# Patient Record
Sex: Female | Born: 1940 | Hispanic: No | Marital: Married | Smoking: Never smoker
Health system: Southern US, Community
[De-identification: ages and names within clinical notes are randomized; demographics above are authoritative.]

## PROBLEM LIST (undated history)

## (undated) DIAGNOSIS — Z9889 Other specified postprocedural states: Secondary | ICD-10-CM

## (undated) DIAGNOSIS — D649 Anemia, unspecified: Secondary | ICD-10-CM

## (undated) DIAGNOSIS — J189 Pneumonia, unspecified organism: Secondary | ICD-10-CM

## (undated) DIAGNOSIS — M1990 Unspecified osteoarthritis, unspecified site: Secondary | ICD-10-CM

## (undated) DIAGNOSIS — J45909 Unspecified asthma, uncomplicated: Secondary | ICD-10-CM

## (undated) DIAGNOSIS — R112 Nausea with vomiting, unspecified: Secondary | ICD-10-CM

## (undated) DIAGNOSIS — K759 Inflammatory liver disease, unspecified: Secondary | ICD-10-CM

## (undated) HISTORY — PX: TONSILLECTOMY: SUR1361

## (undated) HISTORY — PX: APPENDECTOMY: SHX54

## (undated) HISTORY — PX: OTHER SURGICAL HISTORY: SHX169

---

## 2017-01-02 ENCOUNTER — Other Ambulatory Visit: Payer: Self-pay | Admitting: Family Medicine

## 2017-01-02 DIAGNOSIS — R1084 Generalized abdominal pain: Secondary | ICD-10-CM

## 2017-01-11 ENCOUNTER — Ambulatory Visit
Admission: RE | Admit: 2017-01-11 | Discharge: 2017-01-11 | Disposition: A | Discharge status: Home / Self Care | Payer: Medicare Other | Source: Ambulatory Visit | Attending: Family Medicine | Admitting: Family Medicine

## 2017-01-11 DIAGNOSIS — R1084 Generalized abdominal pain: Secondary | ICD-10-CM

## 2017-01-25 ENCOUNTER — Other Ambulatory Visit: Payer: Self-pay | Admitting: Surgery

## 2017-02-01 ENCOUNTER — Other Ambulatory Visit: Payer: Self-pay | Admitting: Surgery

## 2017-02-11 NOTE — Patient Instructions (Signed)
Phoebe PerchJean E Neuroth  02/11/2017   Your procedure is scheduled on: 02/20/17  [facility]  Entrance [place]  elevators to 3rd floor to  Short Stay Center at     [phone].   Call this number if you have problems the morning of surgery [phone]    Remember: ONLY 1 PERSON MAY GO WITH YOU TO SHORT STAY TO GET  READY MORNING OF YOUR SURGERY.  Do not eat food or drink liquids :After Midnight.     Take these medicines the morning of surgery with A SIP OF WATER: NONE                                You may not have any metal on your body including hair pins and              piercings  Do not wear jewelry, make-up, lotions, powders or perfumes, deodorant             Do not wear nail polish.  Do not shave  48 hours prior to surgery.              Do not bring valuables to the hospital. [facility] IS NOT             RESPONSIBLE   FOR VALUABLES.  Contacts, dentures or bridgework may not be worn into surgery.      Patients discharged the day of surgery will not be allowed to drive home.  Name and phone number of your driver:  Special Instructions: N/A              Please read over the following fact sheets you were given: _____________________________________________________________________             [facility] - Preparing for [facility] Before surgery, you can play an important role.  Because skin is not sterile, your skin needs to be as free of germs as possible.  You can reduce the number of germs on your skin by washing with CHG (chlorahexidine gluconate) soap before surgery.  CHG is an antiseptic cleaner which kills germs and bonds with the skin to continue killing germs even after washing. Please DO NOT use if you have an allergy to CHG or antibacterial soaps.  If your skin becomes reddened/irritated stop using the CHG and inform your nurse when you arrive at Short Stay. Do not shave (including legs and underarms) for at least 48 hours prior to the first  CHG shower.  You may shave your face/neck. Please follow these instructions carefully:  1.  Shower with CHG Soap the night before surgery and the  morning of Surgery.  2.  If you choose to wash your hair, wash your hair first as usual with your  normal  shampoo.  3.  After you shampoo, rinse your hair and body thoroughly to remove the  shampoo.                           4.  Use CHG as you would any other liquid soap.  You can apply chg directly  to the skin and wash                       Gently with a scrungie or clean  washcloth.  5.  Apply the CHG Soap to your body ONLY FROM THE NECK DOWN.   Do not use on face/ open                           Wound or open sores. Avoid contact with eyes, ears mouth and genitals (private parts).                       Wash face,  Genitals (private parts) with your normal soap.             6.  Wash thoroughly, paying special attention to the area where your surgery  will be performed.  7.  Thoroughly rinse your body with warm water from the neck down.  8.  DO NOT shower/wash with your normal soap after using and rinsing off  the CHG Soap.                9.  Pat yourself dry with a clean towel.            10.  Wear clean pajamas.            11.  Place clean sheets on your bed the night of your first shower and do not  sleep with pets. Day of Surgery : Do not apply any lotions/deodorants the morning of surgery.  Please wear clean clothes to the hospital/surgery center.  FAILURE TO FOLLOW THESE INSTRUCTIONS MAY RESULT IN THE CANCELLATION OF YOUR SURGERY PATIENT SIGNATURE_________________________________  NURSE SIGNATURE__________________________________  ________________________________________________________________________

## 2017-02-12 ENCOUNTER — Encounter (HOSPITAL_COMMUNITY)
Admission: RE | Admit: 2017-02-12 | Discharge: 2017-02-12 | Disposition: A | Discharge status: Home / Self Care | Payer: Medicare Other | Source: Ambulatory Visit | Attending: Surgery | Admitting: Surgery

## 2017-02-12 ENCOUNTER — Encounter (HOSPITAL_COMMUNITY): Payer: Self-pay

## 2017-02-12 ENCOUNTER — Encounter (INDEPENDENT_AMBULATORY_CARE_PROVIDER_SITE_OTHER): Payer: Self-pay

## 2017-02-12 DIAGNOSIS — K8080 Other cholelithiasis without obstruction: Secondary | ICD-10-CM | POA: Diagnosis not present

## 2017-02-12 DIAGNOSIS — Z01812 Encounter for preprocedural laboratory examination: Secondary | ICD-10-CM | POA: Diagnosis present

## 2017-02-12 HISTORY — DX: Anemia, unspecified: D64.9

## 2017-02-12 HISTORY — DX: Pneumonia, unspecified organism: J18.9

## 2017-02-12 HISTORY — DX: Other specified postprocedural states: Z98.890

## 2017-02-12 HISTORY — DX: Unspecified asthma, uncomplicated: J45.909

## 2017-02-12 HISTORY — DX: Nausea with vomiting, unspecified: R11.2

## 2017-02-12 HISTORY — DX: Inflammatory liver disease, unspecified: K75.9

## 2017-02-12 HISTORY — DX: Unspecified osteoarthritis, unspecified site: M19.90

## 2017-02-12 LAB — COMPREHENSIVE METABOLIC PANEL
ALT: 14 U/L (ref 14–54)
ANION GAP: 5 (ref 5–15)
AST: 19 U/L (ref 15–41)
Albumin: 4.3 g/dL (ref 3.5–5.0)
Alkaline Phosphatase: 67 U/L (ref 38–126)
BUN: 22 mg/dL — ABNORMAL HIGH (ref 6–20)
CHLORIDE: 110 mmol/L (ref 101–111)
CO2: 25 mmol/L (ref 22–32)
CREATININE: 1.04 mg/dL — AB (ref 0.44–1.00)
Calcium: 9.3 mg/dL (ref 8.9–10.3)
GFR calc non Af Amer: 50 mL/min — ABNORMAL LOW (ref >60)
GFR, EST AFRICAN AMERICAN: 58 mL/min — AB (ref >60)
Glucose, Bld: 97 mg/dL (ref 65–99)
Potassium: 4.8 mmol/L (ref 3.5–5.1)
SODIUM: 140 mmol/L (ref 135–145)
Total Bilirubin: 0.4 mg/dL (ref 0.3–1.2)
Total Protein: 7.2 g/dL (ref 6.5–8.1)

## 2017-02-12 LAB — CBC
HCT: 36.3 % (ref 36.0–46.0)
Hemoglobin: 12.1 g/dL (ref 12.0–15.0)
MCH: 31.3 pg (ref 26.0–34.0)
MCHC: 33.3 g/dL (ref 30.0–36.0)
MCV: 93.8 fL (ref 78.0–100.0)
PLATELETS: 269 10*3/uL (ref 150–400)
RBC: 3.87 MIL/uL (ref 3.87–5.11)
RDW: 13.2 % (ref 11.5–15.5)
WBC: 7.5 10*3/uL (ref 4.0–10.5)

## 2017-02-12 NOTE — Progress Notes (Signed)
OV NOTE 01/02/17 CHART ekg 01/02/17 chart cxr 01/02/17 chart

## 2017-02-19 NOTE — H&P (Signed)
Robin Fisher 01/25/2017 11:12 AM Location: [facility] Patient #: [id number] DOB: [birth year] Undefined / Language: Undefined / Race: Refused to Report/Unreported Female   History of Present Illness (Teria Khachatryan A. Magnus Ivan MD; 01/25/2017 11:47 AM) Patient words: New-Gallstone.  The patient is a 76 year old female who presents for evaluation of gall stones. This is a pleasant patient referred to me by Dr. Benedetto Goad for evaluation of cholelithiasis. The patient reports that her actual first attack of epigastric abdominal discomfort was several years ago. She then had another attack in December which was similar. Since then she has had several more attacks of epigastric abdominal pain hurting due to the back. Her last attack was a month ago. She is now minimal low-fat diet and has had no discomfort. She underwent an ultrasound showing her to have a gallbladder filled with gallstones. She also had multiple cysts in her kidneys bilaterally. There were suggestions of calcifications so an MRI was recommended by radiology draw a malignancy. She reports currently she is completely pain-free. She is very apprehensive about surgery. She also reports that she is claustrophobic and does not want an MRI if possible. She reports she moves her bowels normally. When she had the pain again, it was sharp and moderate in intensity   Past Surgical History Doristine Devoid, CMA; 01/25/2017 11:12 AM) Appendectomy  Cataract Surgery  Bilateral. Oral Surgery  Tonsillectomy   Diagnostic Studies History Doristine Devoid, CMA; 01/25/2017 11:12 AM) Mammogram  >3 years ago Pap Smear  >5 years ago  Allergies Doristine Devoid, CMA; 01/25/2017 11:13 AM) Penicillin G Sodium *PENICILLINS*  PredniSONE *CORTICOSTEROIDS*  Benadryl *ANTIHISTAMINES*   Medication History (Chemira Jones, CMA; 01/25/2017 11:13 AM) No Current Medications Medications Reconciled  Social History Doristine Devoid, CMA; 01/25/2017  11:12 AM) Caffeine use  Carbonated beverages, Tea. No alcohol use  No drug use  Tobacco use  Never smoker.  Family History Doristine Devoid, CMA; 01/25/2017 11:12 AM) Arthritis  Mother. Bleeding disorder  Family Members In General. Diabetes Mellitus  Family Members In General. Hypertension  Father. Migraine Headache  Daughter. Prostate Cancer  Brother. Respiratory Condition  Mother.  Pregnancy / Birth History Doristine Devoid, CMA; 01/25/2017 11:12 AM) Age at menarche  13 years. Age of menopause  41-55 Gravida  2 Length (months) of breastfeeding  3-6 Maternal age  63-30 Para  2  Other Problems Doristine Devoid, CMA; 01/25/2017 11:12 AM) Asthma  Chest pain  Cholelithiasis  Hemorrhoids     Review of Systems (Chemira Jones CMA; 01/25/2017 11:12 AM) General Present- Fatigue. Not Present- Appetite Loss, Chills, Fever, Night Sweats, Weight Gain and Weight Loss. Skin Present- Rash. Not Present- Change in Wart/Mole, Dryness, Hives, Jaundice, New Lesions, Non-Healing Wounds and Ulcer. HEENT Present- Seasonal Allergies. Not Present- Earache, Hearing Loss, Hoarseness, Nose Bleed, Oral Ulcers, Ringing in the Ears, Sinus Pain, Sore Throat, Visual Disturbances, Wears glasses/contact lenses and Yellow Eyes. Respiratory Not Present- Bloody sputum, Chronic Cough, Difficulty Breathing, Snoring and Wheezing. Breast Not Present- Breast Mass, Breast Pain, Nipple Discharge and Skin Changes. Cardiovascular Present- Chest Pain. Not Present- Difficulty Breathing Lying Down, Leg Cramps, Palpitations, Rapid Heart Rate, Shortness of Breath and Swelling of Extremities. Gastrointestinal Present- Bloating. Not Present- Abdominal Pain, Bloody Stool, Change in Bowel Habits, Chronic diarrhea, Constipation, Difficulty Swallowing, Excessive gas, Gets full quickly at meals, Hemorrhoids, Indigestion, Nausea, Rectal Pain and Vomiting. Musculoskeletal Not Present- Back Pain, Joint Pain, Joint Stiffness,  Muscle Pain, Muscle Weakness and Swelling of Extremities. Neurological Not Present- Decreased Memory, Fainting, Headaches,  Numbness, Seizures, Tingling, Tremor, Trouble walking and Weakness. Psychiatric Not Present- Anxiety, Bipolar, Change in Sleep Pattern, Depression, Fearful and Frequent crying. Endocrine Not Present- Cold Intolerance, Excessive Hunger, Hair Changes, Heat Intolerance, Hot flashes and New Diabetes. Hematology Not Present- Blood Thinners, Easy Bruising, Excessive bleeding, Gland problems, HIV and Persistent Infections.  Vitals (Chemira Jones CMA; 01/25/2017 11:13 AM) 01/25/2017 11:13 AM Weight: 147.4 lb Height: 62in Body Surface Area: 1.68 m Body Mass Index: 26.96 kg/m  Pulse: 111 (Regular)  BP: 110/80 (Sitting, Left Arm, Standard)       Physical Exam (Rami Waddle A. Magnus IvanBlackman MD; 01/25/2017 11:48 AM) General Mental Status-Alert. General Appearance-Consistent with stated age. Hydration-Well hydrated. Voice-Normal.  Head and Neck Head-normocephalic, atraumatic with no lesions or palpable masses.  Eye Eyeball - Bilateral-Extraocular movements intact. Sclera/Conjunctiva - Bilateral-No scleral icterus.  Chest and Lung Exam Chest and lung exam reveals -quiet, even and easy respiratory effort with no use of accessory muscles and on auscultation, normal breath sounds, no adventitious sounds and normal vocal resonance. Inspection Chest Wall - Normal. Back - normal.  Cardiovascular Cardiovascular examination reveals -on palpation PMI is normal in location and amplitude, no palpable S3 or S4. Normal cardiac borders., normal heart sounds, regular rate and rhythm with no murmurs, carotid auscultation reveals no bruits and normal pedal pulses bilaterally.  Abdomen Inspection Inspection of the abdomen reveals - No Hernias. Skin - Scar - no surgical scars. Palpation/Percussion Palpation and Percussion of the abdomen reveal - Soft, Non Tender, No  Rebound tenderness, No Rigidity (guarding) and No hepatosplenomegaly. Auscultation Auscultation of the abdomen reveals - Bowel sounds normal.  Neurologic - Did not examine.  Musculoskeletal - Did not examine.    Assessment & Plan (Quaneshia Wareing A. Magnus IvanBlackman MD; 01/25/2017 11:49 AM) SYMPTOMATIC CHOLELITHIASIS (K80.20) Impression: I had a long discussion with the patient and her husband regarding gallbladder disease. I gave him literature regarding cholecystectomy as well. We discussed the anatomy. We discussed all the signs and symptoms. We discussed the reasons for surgery. We discussed the potential for common bile duct stones and pancreatitis should she elect not to have surgery. I discussed the surgical procedure in detail including all the risks. She will think about it and is very hesitant to have surgery at her age. I explained that this would not be a contraindication to surgery. Because the ultrasound findings regarding the kidneys, I will refer her to urology to see if she needs any further workup or to see if they agree on MRI. She will call me back should she decide whether she wants to have her gallbladder removed. Current Plans Follow up if no improvement or if symptoms worsen

## 2017-02-20 ENCOUNTER — Ambulatory Visit (HOSPITAL_COMMUNITY): Payer: Medicare Other | Admitting: Anesthesiology

## 2017-02-20 ENCOUNTER — Encounter (HOSPITAL_COMMUNITY): Payer: Self-pay | Admitting: *Deleted

## 2017-02-20 ENCOUNTER — Ambulatory Visit (HOSPITAL_COMMUNITY)
Admission: RE | Admit: 2017-02-20 | Discharge: 2017-02-20 | Disposition: A | Discharge status: Home / Self Care | Payer: Medicare Other | Source: Ambulatory Visit | Attending: Surgery | Admitting: Surgery

## 2017-02-20 ENCOUNTER — Encounter (HOSPITAL_COMMUNITY)
Admission: RE | Disposition: A | Discharge status: Home / Self Care | Payer: Self-pay | Source: Ambulatory Visit | Attending: Surgery

## 2017-02-20 DIAGNOSIS — J45909 Unspecified asthma, uncomplicated: Secondary | ICD-10-CM | POA: Insufficient documentation

## 2017-02-20 DIAGNOSIS — K8010 Calculus of gallbladder with chronic cholecystitis without obstruction: Secondary | ICD-10-CM | POA: Insufficient documentation

## 2017-02-20 DIAGNOSIS — M1990 Unspecified osteoarthritis, unspecified site: Secondary | ICD-10-CM | POA: Diagnosis not present

## 2017-02-20 DIAGNOSIS — K8020 Calculus of gallbladder without cholecystitis without obstruction: Secondary | ICD-10-CM | POA: Diagnosis present

## 2017-02-20 HISTORY — PX: CHOLECYSTECTOMY: SHX55

## 2017-02-20 SURGERY — LAPAROSCOPIC CHOLECYSTECTOMY
Anesthesia: General | Site: Abdomen

## 2017-02-20 MED ORDER — ONDANSETRON HCL 4 MG/2ML IJ SOLN
INTRAMUSCULAR | Status: DC | PRN
  Administered 2017-02-20: 4 mg via INTRAVENOUS

## 2017-02-20 MED ORDER — ACETAMINOPHEN 325 MG PO TABS
650.0000 mg | ORAL_TABLET | ORAL | Status: DC | PRN
  Administered 2017-02-20: 650 mg via ORAL
  Filled 2017-02-20: qty 2

## 2017-02-20 MED ORDER — SODIUM CHLORIDE 0.9% FLUSH: 3.0000 mL | Freq: Two times a day (BID) | INTRAVENOUS | Status: DC

## 2017-02-20 MED ORDER — PROPOFOL 10 MG/ML IV BOLUS
INTRAVENOUS | Status: DC | PRN
  Administered 2017-02-20: 150 mg via INTRAVENOUS

## 2017-02-20 MED ORDER — SODIUM CHLORIDE 0.9 % IR SOLN
Status: DC | PRN
  Administered 2017-02-20: 1000 mL

## 2017-02-20 MED ORDER — OXYCODONE HCL 5 MG/5ML PO SOLN: 5.0000 mg | Freq: Once | ORAL | Status: AC | PRN

## 2017-02-20 MED ORDER — LACTATED RINGERS IV SOLN
INTRAVENOUS | Status: DC
  Administered 2017-02-20 (×2): via INTRAVENOUS
  Administered 2017-02-20: 1345 mL via INTRAVENOUS

## 2017-02-20 MED ORDER — LIDOCAINE 2% (20 MG/ML) 5 ML SYRINGE
INTRAMUSCULAR | Status: AC
  Filled 2017-02-20: qty 15

## 2017-02-20 MED ORDER — OXYCODONE HCL 5 MG PO TABS: 5.0000 mg | ORAL_TABLET | ORAL | Status: DC | PRN

## 2017-02-20 MED ORDER — FENTANYL CITRATE (PF) 250 MCG/5ML IJ SOLN
INTRAMUSCULAR | Status: AC
  Filled 2017-02-20: qty 5

## 2017-02-20 MED ORDER — FENTANYL CITRATE (PF) 250 MCG/5ML IJ SOLN
INTRAMUSCULAR | Status: DC | PRN
  Administered 2017-02-20 (×2): 100 ug via INTRAVENOUS
  Administered 2017-02-20 (×3): 50 ug via INTRAVENOUS

## 2017-02-20 MED ORDER — ROCURONIUM BROMIDE 50 MG/5ML IV SOSY
PREFILLED_SYRINGE | INTRAVENOUS | Status: AC
  Filled 2017-02-20: qty 10

## 2017-02-20 MED ORDER — TRAMADOL HCL 50 MG PO TABS: 50.0000 mg | ORAL_TABLET | Freq: Four times a day (QID) | ORAL | 0 refills | Status: AC | PRN

## 2017-02-20 MED ORDER — BUPIVACAINE-EPINEPHRINE 0.25% -1:200000 IJ SOLN
INTRAMUSCULAR | Status: DC | PRN
  Administered 2017-02-20: 20 mL

## 2017-02-20 MED ORDER — SODIUM CHLORIDE 0.9% FLUSH: 3.0000 mL | INTRAVENOUS | Status: DC | PRN

## 2017-02-20 MED ORDER — ACETAMINOPHEN 650 MG RE SUPP
650.0000 mg | RECTAL | Status: DC | PRN
  Filled 2017-02-20: qty 1

## 2017-02-20 MED ORDER — BUPIVACAINE-EPINEPHRINE (PF) 0.25% -1:200000 IJ SOLN
INTRAMUSCULAR | Status: AC
  Filled 2017-02-20: qty 30

## 2017-02-20 MED ORDER — KETOROLAC TROMETHAMINE 30 MG/ML IJ SOLN
INTRAMUSCULAR | Status: AC
  Filled 2017-02-20: qty 1

## 2017-02-20 MED ORDER — ROCURONIUM BROMIDE 100 MG/10ML IV SOLN
INTRAVENOUS | Status: DC | PRN
  Administered 2017-02-20: 35 mg via INTRAVENOUS

## 2017-02-20 MED ORDER — HYDROMORPHONE HCL-NACL 0.5-0.9 MG/ML-% IV SOSY
PREFILLED_SYRINGE | INTRAVENOUS | Status: AC
  Filled 2017-02-20: qty 2

## 2017-02-20 MED ORDER — FENTANYL CITRATE (PF) 100 MCG/2ML IJ SOLN
INTRAMUSCULAR | Status: AC
  Filled 2017-02-20: qty 2

## 2017-02-20 MED ORDER — MEPERIDINE HCL 50 MG/ML IJ SOLN: 6.2500 mg | INTRAMUSCULAR | Status: DC | PRN

## 2017-02-20 MED ORDER — ONDANSETRON HCL 4 MG/2ML IJ SOLN
INTRAMUSCULAR | Status: AC
  Filled 2017-02-20: qty 2

## 2017-02-20 MED ORDER — MORPHINE SULFATE (PF) 4 MG/ML IV SOLN: 1.0000 mg | INTRAVENOUS | Status: DC | PRN

## 2017-02-20 MED ORDER — CHLORHEXIDINE GLUCONATE CLOTH 2 % EX PADS: 6.0000 | MEDICATED_PAD | Freq: Once | CUTANEOUS | Status: DC

## 2017-02-20 MED ORDER — OXYCODONE HCL 5 MG PO TABS
5.0000 mg | ORAL_TABLET | Freq: Once | ORAL | Status: AC | PRN
  Administered 2017-02-20: 5 mg via ORAL
  Filled 2017-02-20: qty 1

## 2017-02-20 MED ORDER — SODIUM CHLORIDE 0.9 % IV SOLN: 250.0000 mL | INTRAVENOUS | Status: DC | PRN

## 2017-02-20 MED ORDER — SUCCINYLCHOLINE CHLORIDE 20 MG/ML IJ SOLN
INTRAMUSCULAR | Status: DC | PRN
  Administered 2017-02-20: 100 mg via INTRAVENOUS

## 2017-02-20 MED ORDER — HYDROMORPHONE HCL-NACL 0.5-0.9 MG/ML-% IV SOSY
0.2500 mg | PREFILLED_SYRINGE | INTRAVENOUS | Status: DC | PRN
  Administered 2017-02-20: 0.5 mg via INTRAVENOUS
  Administered 2017-02-20 (×2): 0.25 mg via INTRAVENOUS

## 2017-02-20 MED ORDER — SUGAMMADEX SODIUM 200 MG/2ML IV SOLN
INTRAVENOUS | Status: AC
  Filled 2017-02-20: qty 2

## 2017-02-20 MED ORDER — PROMETHAZINE HCL 25 MG/ML IJ SOLN: 6.2500 mg | INTRAMUSCULAR | Status: DC | PRN

## 2017-02-20 MED ORDER — LIDOCAINE HCL (CARDIAC) 20 MG/ML IV SOLN
INTRAVENOUS | Status: DC | PRN
  Administered 2017-02-20: 50 mg via INTRATRACHEAL

## 2017-02-20 MED ORDER — PROPOFOL 10 MG/ML IV BOLUS
INTRAVENOUS | Status: AC
  Filled 2017-02-20: qty 20

## 2017-02-20 MED ORDER — RINGERS IRRIGATION IR SOLN
Status: DC | PRN
  Administered 2017-02-20: 1000 mL

## 2017-02-20 MED ORDER — SUGAMMADEX SODIUM 200 MG/2ML IV SOLN
INTRAVENOUS | Status: DC | PRN
  Administered 2017-02-20: 200 mg via INTRAVENOUS

## 2017-02-20 MED ORDER — CEFAZOLIN SODIUM-DEXTROSE 2-4 GM/100ML-% IV SOLN
2.0000 g | INTRAVENOUS | Status: AC
  Administered 2017-02-20: 2 g via INTRAVENOUS
  Filled 2017-02-20: qty 100

## 2017-02-20 SURGICAL SUPPLY — 29 items
APPLIER CLIP 5 13 M/L LIGAMAX5 (MISCELLANEOUS) ×3
CABLE HIGH FREQUENCY MONO STRZ (ELECTRODE) ×3 IMPLANT
CHLORAPREP W/TINT 26ML (MISCELLANEOUS) ×3 IMPLANT
CLIP APPLIE 5 13 M/L LIGAMAX5 (MISCELLANEOUS) ×1 IMPLANT
COVER MAYO STAND STRL (DRAPES) IMPLANT
DECANTER SPIKE VIAL GLASS SM (MISCELLANEOUS) ×3 IMPLANT
DERMABOND ADVANCED (GAUZE/BANDAGES/DRESSINGS) ×2
DERMABOND ADVANCED .7 DNX12 (GAUZE/BANDAGES/DRESSINGS) ×1 IMPLANT
DRAPE C-ARM 42X120 X-RAY (DRAPES) IMPLANT
ELECT REM PT RETURN 15FT ADLT (MISCELLANEOUS) ×3 IMPLANT
GLOVE SURG SIGNA 7.5 PF LTX (GLOVE) ×3 IMPLANT
GOWN STRL REUS W/TWL XL LVL3 (GOWN DISPOSABLE) ×6 IMPLANT
HEMOSTAT SNOW SURGICEL 2X4 (HEMOSTASIS) ×3 IMPLANT
HEMOSTAT SURGICEL 4X8 (HEMOSTASIS) IMPLANT
IRRIG SUCT STRYKERFLOW 2 WTIP (MISCELLANEOUS)
IRRIGATION SUCT STRKRFLW 2 WTP (MISCELLANEOUS) IMPLANT
KIT BASIN OR (CUSTOM PROCEDURE TRAY) ×3 IMPLANT
POUCH SPECIMEN RETRIEVAL 10MM (ENDOMECHANICALS) ×3 IMPLANT
SCISSORS LAP 5X35 DISP (ENDOMECHANICALS) ×3 IMPLANT
SET CHOLANGIOGRAPH MIX (MISCELLANEOUS) IMPLANT
SET IRRIG TUBING LAPAROSCOPIC (IRRIGATION / IRRIGATOR) ×3 IMPLANT
SLEEVE XCEL OPT CAN 5 100 (ENDOMECHANICALS) ×6 IMPLANT
SUT MNCRL AB 4-0 PS2 18 (SUTURE) ×3 IMPLANT
TOWEL OR 17X26 10 PK STRL BLUE (TOWEL DISPOSABLE) ×3 IMPLANT
TOWEL OR NON WOVEN STRL DISP B (DISPOSABLE) ×3 IMPLANT
TRAY LAPAROSCOPIC (CUSTOM PROCEDURE TRAY) ×3 IMPLANT
TROCAR BLADELESS OPT 5 100 (ENDOMECHANICALS) ×3 IMPLANT
TROCAR XCEL BLUNT TIP 100MML (ENDOMECHANICALS) ×3 IMPLANT
TUBING INSUF HEATED (TUBING) ×3 IMPLANT

## 2017-02-20 NOTE — Anesthesia Preprocedure Evaluation (Signed)
Anesthesia Evaluation  Patient identified by MRN, date of birth, ID band Patient awake    Reviewed: Allergy & Precautions, NPO status , Patient's Chart, lab work & pertinent test results  History of Anesthesia Complications (+) PONV  Airway Mallampati: II  TM Distance: >3 FB Neck ROM: Full    Dental no notable dental hx.    Pulmonary neg pulmonary ROS,    Pulmonary exam normal breath sounds clear to auscultation       Cardiovascular negative cardio ROS Normal cardiovascular exam Rhythm:Regular Rate:Normal     Neuro/Psych negative neurological ROS  negative psych ROS   GI/Hepatic negative GI ROS, Neg liver ROS,   Endo/Other  negative endocrine ROS  Renal/GU negative Renal ROS  negative genitourinary   Musculoskeletal negative musculoskeletal ROS (+) Arthritis ,   Abdominal   Peds negative pediatric ROS (+)  Hematology negative hematology ROS (+)   Anesthesia Other Findings   Reproductive/Obstetrics negative OB ROS                             Anesthesia Physical Anesthesia Plan  ASA: II  Anesthesia Plan: General   Post-op Pain Management:    Induction: Intravenous  PONV Risk Score and Plan: 4 or greater and Ondansetron, Dexamethasone, Propofol, Midazolam and Treatment may vary due to age or medical condition  Airway Management Planned: Oral ETT  Additional Equipment:   Intra-op Plan:   Post-operative Plan: Extubation in OR  Informed Consent: I have reviewed the patients History and Physical, chart, labs and discussed the procedure including the risks, benefits and alternatives for the proposed anesthesia with the patient or authorized representative who has indicated his/her understanding and acceptance.   Dental advisory given  Plan Discussed with: CRNA  Anesthesia Plan Comments:         Anesthesia Quick Evaluation

## 2017-02-20 NOTE — Anesthesia Postprocedure Evaluation (Signed)
Anesthesia Post Note  Patient: Robin Fisher  Procedure(s) Performed: Procedure(s) (LRB): LAPAROSCOPIC CHOLECYSTECTOMY (N/A)     Patient location during evaluation: PACU Anesthesia Type: General Level of consciousness: awake and alert Pain management: pain level controlled Vital Signs Assessment: post-procedure vital signs reviewed and stable Respiratory status: spontaneous breathing, nonlabored ventilation and respiratory function stable Cardiovascular status: blood pressure returned to baseline and stable Postop Assessment: no signs of nausea or vomiting Anesthetic complications: no    Last Vitals:  Vitals:   02/20/17 1415 02/20/17 1430  BP: (!) 134/53 (!) 132/54  Pulse: 85 91  Resp: 15 14  Temp:      Last Pain:  Vitals:   02/20/17 1430  TempSrc:   PainSc: 3                  Lowella CurbWarren Ray Avnoor Koury

## 2017-02-20 NOTE — Transfer of Care (Signed)
Immediate Anesthesia Transfer of Care Note  Patient: Robin Fisher  Procedure(s) Performed: Procedure(s): LAPAROSCOPIC CHOLECYSTECTOMY (N/A)  Patient Location: PACU  Anesthesia Type:General  Level of Consciousness: awake and alert   Airway & Oxygen Therapy: Patient Spontanous Breathing and Patient connected to face mask oxygen  Post-op Assessment: Report given to RN and Post -op Vital signs reviewed and stable  Post vital signs: Reviewed and stable  Last Vitals:  Vitals:   02/20/17 0931  BP: 120/60  Pulse: 73  Resp: 18  Temp: 37 C    Last Pain:  Vitals:   02/20/17 1111  TempSrc:   PainSc: 0-No pain      Patients Stated Pain Goal: 3 (02/20/17 1111)  Complications: No apparent anesthesia complications

## 2017-02-20 NOTE — Progress Notes (Signed)
Care Management Handoff Summary  Patient Details  Name: Robin Fisher  MRN: [id number]  DOB: 1940/10/23  Karie GeorgesOLE, Murielle Stang Webster, RN 02/20/2017, 1640 handoff report to Pauline AusPam Mentley, RN at bedside, no change in initial postop assessment of incision sites.  Pt having right upper lateral arm pain  5/10 and abd pain 5/10 increased after ambulating to BR with assist.  Continues to be drowsy, tylenol given PO after tolerating crackers and liquids.

## 2017-02-20 NOTE — Anesthesia Procedure Notes (Signed)
Procedure Name: Intubation Date/Time: 02/20/2017 12:16 PM Performed by: [place] Sheriff Rodenberg C Pre-anesthesia Checklist: Patient identified, Emergency Drugs available, Suction available and Patient being monitored Patient Re-evaluated:Patient Re-evaluated prior to induction Oxygen Delivery Method: Circle system utilized Preoxygenation: Pre-oxygenation with 100% oxygen Induction Type: IV induction Ventilation: Mask ventilation without difficulty Laryngoscope Size: Mac and 3 Grade View: Grade I Tube type: Oral Tube size: 7.0 mm Number of attempts: 1 Airway Equipment and Method: Stylet and Oral airway Placement Confirmation: ETT inserted through vocal cords under direct vision,  positive ETCO2 and breath sounds checked- equal and bilateral Secured at: 20 cm Tube secured with: Tape Dental Injury: Teeth and Oropharynx as per pre-operative assessment

## 2017-02-20 NOTE — Interval H&P Note (Signed)
History and Physical Interval Note: no change in H and P  02/20/2017 11:48 AM  Robin Fisher  has presented today for surgery, with the diagnosis of symtomatic gallstones  The various methods of treatment have been discussed with the patient and family. After consideration of risks, benefits and other options for treatment, the patient has consented to  Procedure(s): LAPAROSCOPIC CHOLECYSTECTOMY (N/A) as a surgical intervention .  The patient's history has been reviewed, patient examined, no change in status, stable for surgery.  I have reviewed the patient's chart and labs.  Questions were answered to the patient's satisfaction.     Abrahan Fulmore A

## 2017-02-20 NOTE — Op Note (Signed)
Laparoscopic Cholecystectomy Procedure Note  Indications: This patient presents with symptomatic gallbladder disease and will undergo laparoscopic cholecystectomy.  Pre-operative Diagnosis: symptomatic cholelithiasis  Post-operative Diagnosis: Same  Surgeon: Abigail MiyamotoBLACKMAN,Rolinda Impson A   Assistants: 0  Anesthesia: General endotracheal anesthesia  ASA Class: 2  Procedure Details  The patient was seen again in the Holding Room. The risks, benefits, complications, treatment options, and expected outcomes were discussed with the patient. The possibilities of reaction to medication, pulmonary aspiration, perforation of viscus, bleeding, recurrent infection, finding a normal gallbladder, the need for additional procedures, failure to diagnose a condition, the possible need to convert to an open procedure, and creating a complication requiring transfusion or operation were discussed with the patient. The likelihood of improving the patient's symptoms with return to their baseline status is good.  The patient and/or family concurred with the proposed plan, giving informed consent. The site of surgery properly noted. The patient was taken to Operating Room, identified as Robin BannerJean E Ellerbe and the procedure verified as Laparoscopic Cholecystectomy with Intraoperative Cholangiogram. A Time Out was held and the above information confirmed.  Prior to the induction of general anesthesia, antibiotic prophylaxis was administered. General endotracheal anesthesia was then administered and tolerated well. After the induction, the abdomen was prepped with Chloraprep and draped in sterile fashion. The patient was positioned in the supine position.  Local anesthetic agent was injected into the skin near the umbilicus and an incision made. We dissected down to the abdominal fascia with blunt dissection.  The fascia was incised vertically and we entered the peritoneal cavity bluntly.  A pursestring suture of 0-Vicryl was placed  around the fascial opening right at an umbilical hernia.  The Hasson cannula was inserted and secured with the stay suture.  Pneumoperitoneum was then created with CO2 and tolerated well without any adverse changes in the patient's vital signs. A 5-mm port was placed in the subxiphoid position.  Two 5-mm ports were placed in the right upper quadrant. All skin incisions were infiltrated with a local anesthetic agent before making the incision and placing the trocars.   We positioned the patient in reverse Trendelenburg, tilted slightly to the patient's left.  The gallbladder was identified, the fundus grasped and retracted cephalad. The gallbladder seems calcified and full of stones. Adhesions were lysed bluntly and with the electrocautery where indicated, taking care not to injure any adjacent organs or viscus. The infundibulum was grasped and retracted laterally, exposing the peritoneum overlying the triangle of Calot. This was then divided and exposed in a blunt fashion. The cystic duct was clearly identified and bluntly dissected circumferentially. A critical view of the cystic duct and cystic artery was obtained.  The cystic duct was then ligated with clips and divided. The cystic artery was, dissected free, ligated with clips and divided as well.   The gallbladder was dissected from the liver bed in retrograde fashion with the electrocautery. The gallbladder was removed and placed in an Endocatch sac. The liver bed was irrigated and inspected. Hemostasis was achieved with the electrocautery as well as a piece of surgical snow.  Copious irrigation was utilized and was repeatedly aspirated until clear.  The gallbladder and Endocatch sac were then removed through the umbilical port site.  The pursestring suture was used to close the umbilical fascia.  I placed another 0 Vicryl suture in a figure of 8 fashion to assure facial closure  We again inspected the right upper quadrant for hemostasis.   Pneumoperitoneum was released as we removed the  trocars.  4-0 Monocryl was used to close the skin.   Skin glue was then  applied. The patient was then extubated and brought to the recovery room in stable condition. Instrument, sponge, and needle counts were correct at closure and at the conclusion of the case.   Findings: Cholecystitis with Cholelithiasis  Estimated Blood Loss: less than 50 mL         Drains: 0         Specimens: Gallbladder           Complications: None; patient tolerated the procedure well.         Disposition: PACU - hemodynamically stable.         Condition: stable

## 2017-02-20 NOTE — Discharge Instructions (Signed)
[facility] ______[facility] LAPAROSCOPIC SURGERY: POST OP INSTRUCTIONS Always review your discharge instruction sheet given to you by the facility where your surgery was performed. IF YOU HAVE DISABILITY OR FAMILY LEAVE FORMS, YOU MUST BRING THEM TO THE OFFICE FOR PROCESSING.   DO NOT GIVE THEM TO YOUR DOCTOR.  1. A prescription for pain medication may be given to you upon discharge.  Take your pain medication as prescribed, if needed.  If narcotic pain medicine is not needed, then you may take acetaminophen (Tylenol) or ibuprofen (Advil) as needed. 2. Take your usually prescribed medications unless otherwise directed. 3. If you need a refill on your pain medication, please contact your pharmacy.  They will contact our office to request authorization. Prescriptions will not be filled after 5pm or on week-ends. 4. You should follow a light diet the first few days after arrival home, such as soup and crackers, etc.  Be sure to include lots of fluids daily. 5. Most patients will experience some swelling and bruising in the area of the incisions.  Ice packs will help.  Swelling and bruising can take several days to resolve.  6. It is common to experience some constipation if taking pain medication after surgery.  Increasing fluid intake and taking a stool softener (such as Colace) will usually help or prevent this problem from occurring.  A mild laxative (Milk of Magnesia or Miralax) should be taken according to package instructions if there are no bowel movements after 48 hours. 7. Unless discharge instructions indicate otherwise, you may remove your bandages 24-48 hours after surgery, and you may shower at that time.  You may have steri-strips (small skin tapes) in place directly over the incision.  These strips should be left on the skin for 7-10 days.  If your surgeon used skin glue on the incision, you may shower in 24 hours.  The glue will flake off over the next 2-3 weeks.  Any sutures or  staples will be removed at the office during your follow-up visit. 8. ACTIVITIES:  You may resume regular (light) daily activities beginning the next day--such as daily self-care, walking, climbing stairs--gradually increasing activities as tolerated.  You may have sexual intercourse when it is comfortable.  Refrain from any heavy lifting or straining until approved by your doctor. a. You may drive when you are no longer taking prescription pain medication, you can comfortably wear a seatbelt, and you can safely maneuver your car and apply brakes. b. RETURN TO WORK:  __________________________________________________________ 9. You should see your doctor in the office for a follow-up appointment approximately 2-3 weeks after your surgery.  Make sure that you call for this appointment within a day or two after you arrive home to insure a convenient appointment time. 10. OTHER INSTRUCTIONS:OK TO SHOWER STARTING TOMORROW 11. NO LIFTING MORE THAN 15 POUNDS FOR 2 WEEKS 12. ICE PACK, IBUPROFEN, TYLENOL ALSO FOR PAIN 13. STOOL SOFTENER FOR CONSTIPATION __________________________________________________________________________________________________________________________ __________________________________________________________________________________________________________________________ WHEN TO CALL YOUR DOCTOR: 1. Fever over 101.0 2. Inability to urinate 3. Continued bleeding from incision. 4. Increased pain, redness, or drainage from the incision. 5. Increasing abdominal pain  The clinic staff is available to answer your questions during regular business hours.  Please dont hesitate to call and ask to speak to one of the nurses for clinical concerns.  If you have a medical emergency, go to the nearest emergency room or call 911.  A surgeon from [facility] is always on call at the hospital. [address]  [address], [address], [place], [place]  [id number] ? [address] [address], [place], [place]    [id number] [phone] ? [phone] ? FAX [phone]) [phone] Web site: [email or web address]  General Anesthesia, Adult, Care After These instructions provide you with information about caring for yourself after your procedure. Your health care provider may also give you more specific instructions. Your treatment has been planned according to current medical practices, but problems sometimes occur. Call your health care provider if you have any problems or questions after your procedure. What can I expect after the procedure? After the procedure, it is common to have:  Vomiting.  A sore throat.  Mental slowness.  It is common to feel:  Nauseous.  Cold or shivery.  Sleepy.  Tired.  Sore or achy, even in parts of your body where you did not have surgery.  Follow these instructions at home: For at least 24 hours after the procedure:  Do not: ? Participate in activities where you could fall or become injured. ? Drive. ? Use heavy machinery. ? Drink alcohol. ? Take sleeping pills or medicines that cause drowsiness. ? Make important decisions or sign legal documents. ? Take care of children on your own.  Rest. Eating and drinking  If you vomit, drink water, juice, or soup when you can drink without vomiting.  Drink enough fluid to keep your urine clear or pale yellow.  Make sure you have little or no nausea before eating solid foods.  Follow the diet recommended by your health care provider. General instructions  Have a responsible adult stay with you until you are awake and alert.  Return to your normal activities as told by your health care provider. Ask your health care provider what activities are safe for you.  Take over-the-counter and prescription medicines only as told by your health care provider.  If you smoke, do not smoke without supervision.  Keep all follow-up visits as told by your health care provider. This is important. Contact a health  care provider if:  You continue to have nausea or vomiting at home, and medicines are not helpful.  You cannot drink fluids or start eating again.  You cannot urinate after 8-12 hours.  You develop a skin rash.  You have fever.  You have increasing redness at the site of your procedure. Get help right away if:  You have difficulty breathing.  You have chest pain.  You have unexpected bleeding.  You feel that you are having a life-threatening or urgent problem. This information is not intended to replace advice given to you by your health care provider. Make sure you discuss any questions you have with your health care provider. Document Released: 11/05/2000 Document Revised: 01/02/2016 Document Reviewed: 07/14/2015 Elsevier Interactive Patient Education  [employer]2018 [employer]

## 2017-02-21 ENCOUNTER — Encounter (HOSPITAL_COMMUNITY): Payer: Self-pay | Admitting: Surgery

## 2017-03-25 ENCOUNTER — Other Ambulatory Visit: Payer: Self-pay | Admitting: Surgery

## 2017-03-25 DIAGNOSIS — Z9049 Acquired absence of other specified parts of digestive tract: Secondary | ICD-10-CM

## 2017-03-26 ENCOUNTER — Ambulatory Visit
Admission: RE | Admit: 2017-03-26 | Discharge: 2017-03-26 | Disposition: A | Discharge status: Home / Self Care | Payer: Medicare Other | Source: Ambulatory Visit | Attending: Surgery | Admitting: Surgery

## 2017-03-26 DIAGNOSIS — Z9049 Acquired absence of other specified parts of digestive tract: Secondary | ICD-10-CM

## 2018-12-17 IMAGING — US US ABDOMEN COMPLETE
1 series · 13 of 25 positions shown · non-contrast
Comparison: None.

CLINICAL DATA: Patient with abdominal pain.

EXAM:
ABDOMEN ULTRASOUND COMPLETE

[Series 1: us abdomen complete · 0.23mm/px · 13 of 108 slices shown]
[im 1/108]
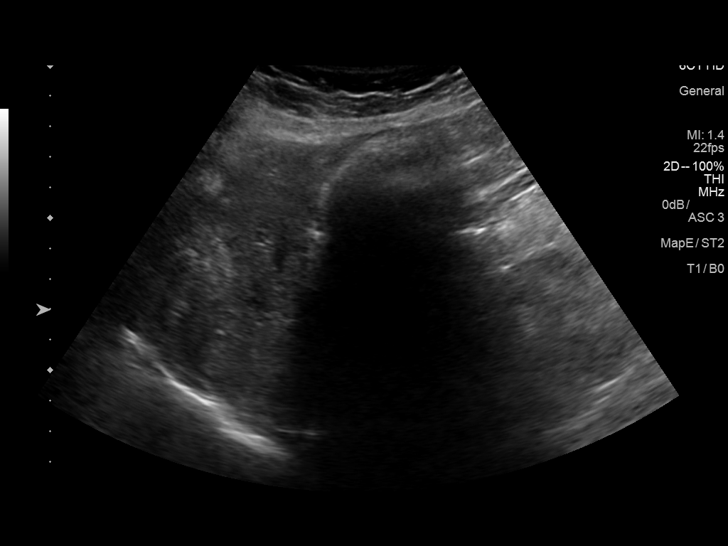
[im 9/108]
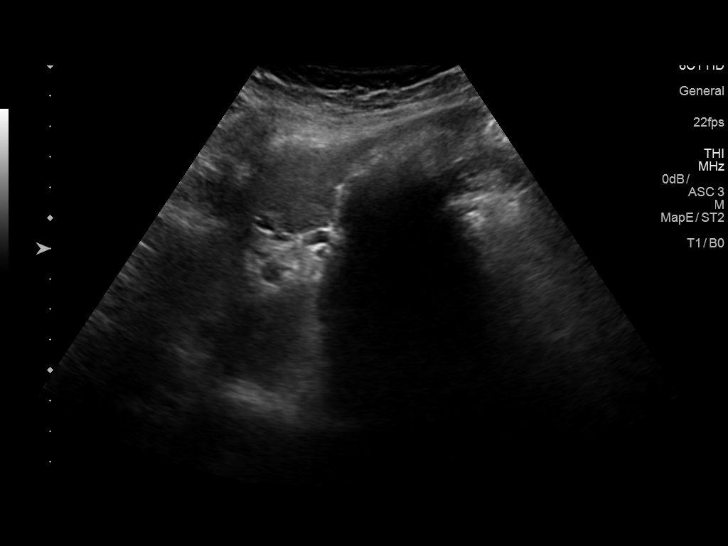
[im 18/108]
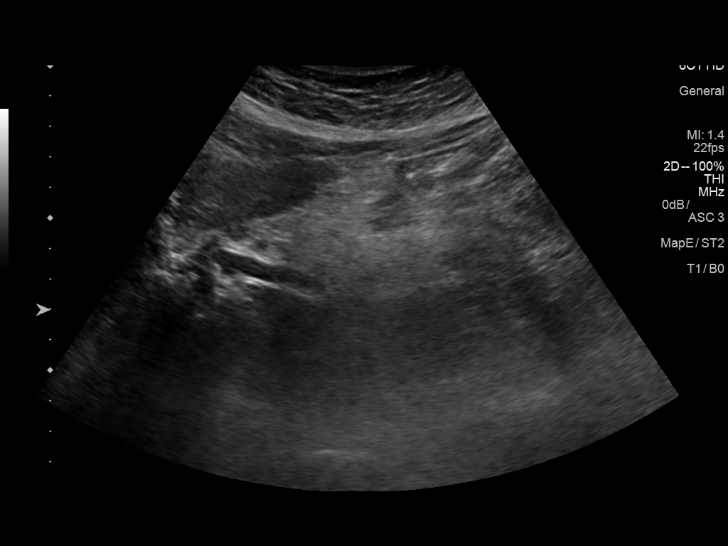
[im 27/108]
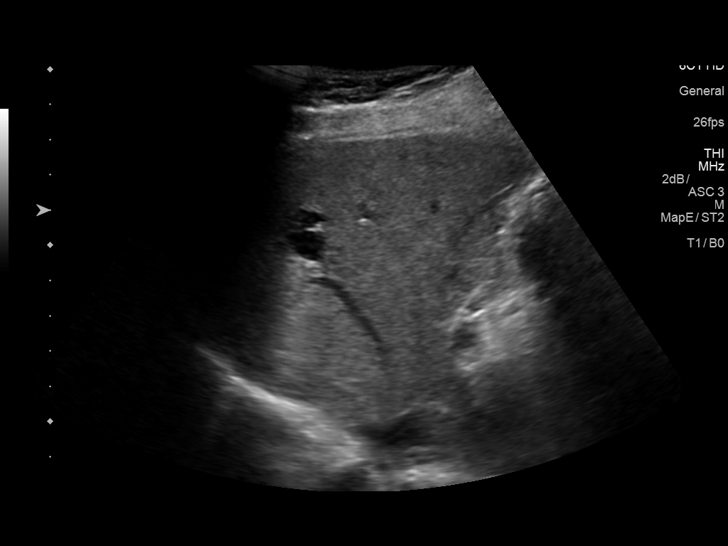
[im 36/108]
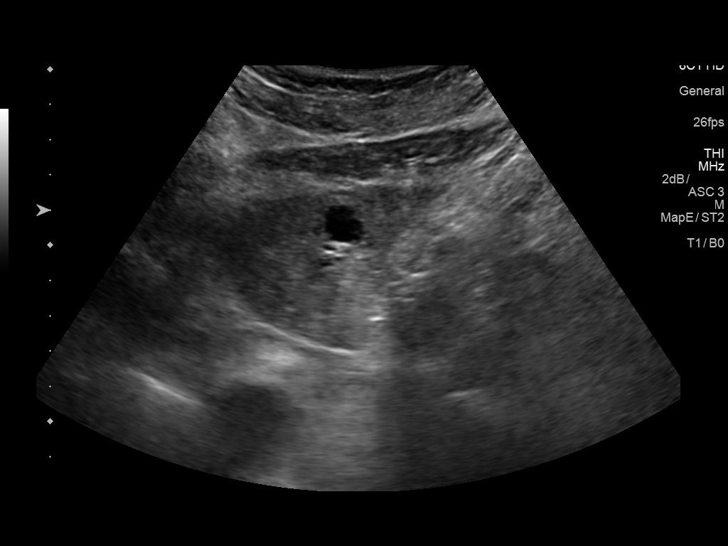
[im 45/108]
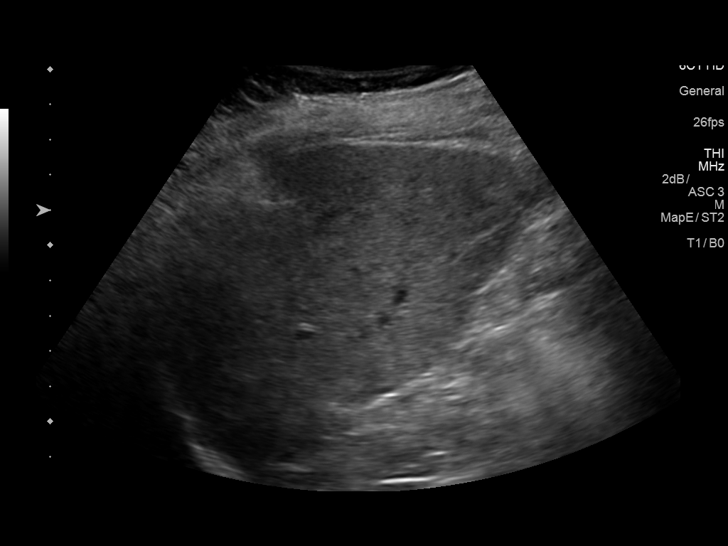
[im 54/108]
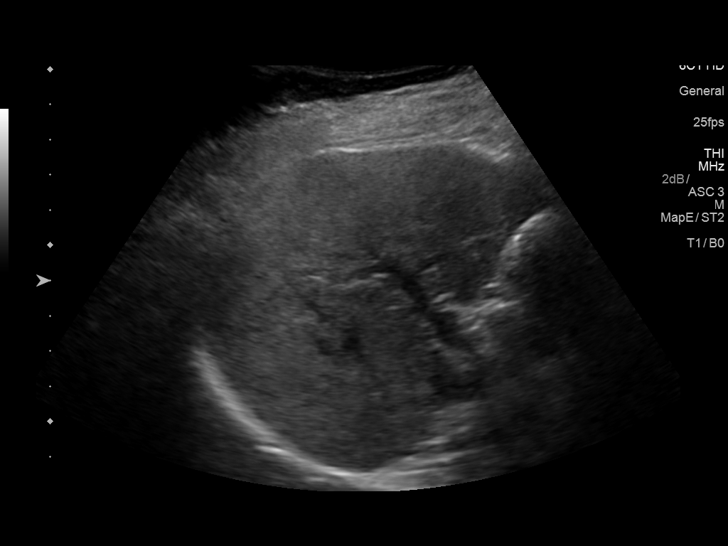
[im 63/108]
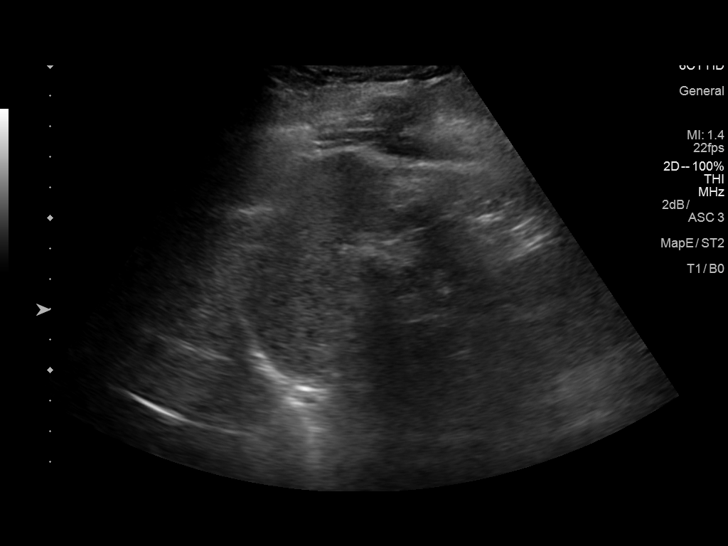
[im 72/108]
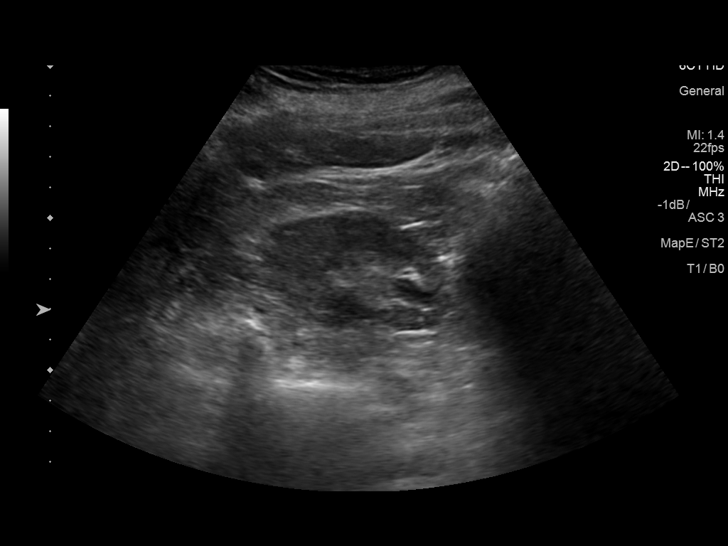
[im 81/108]
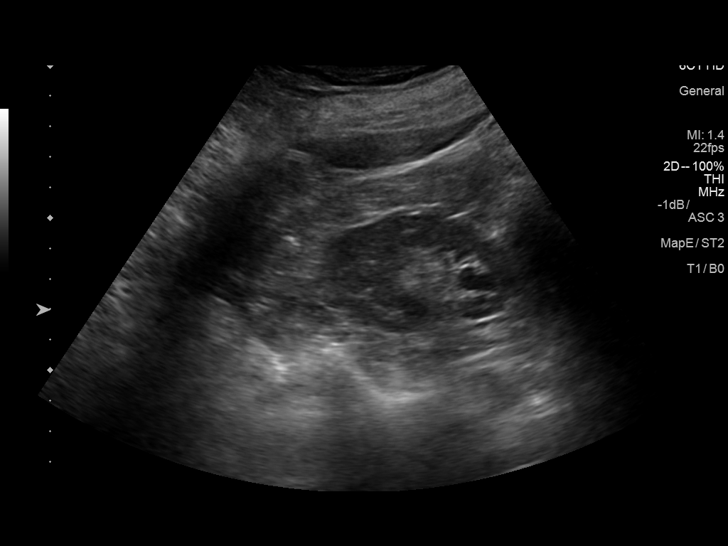
[im 90/108]
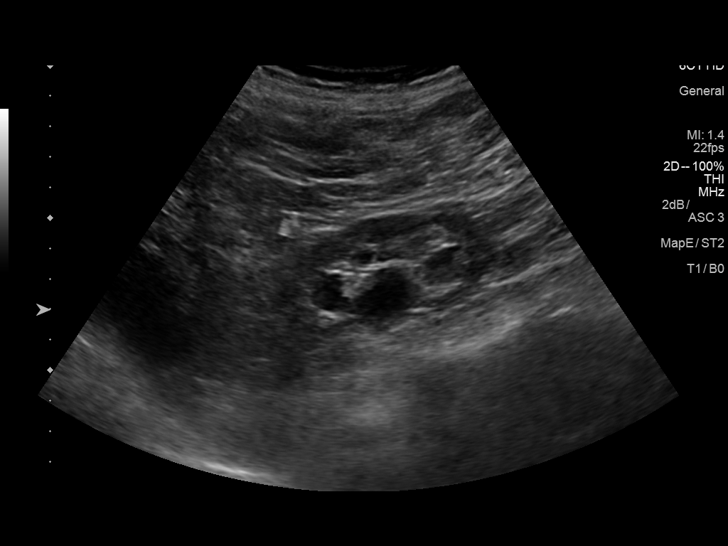
[im 99/108]
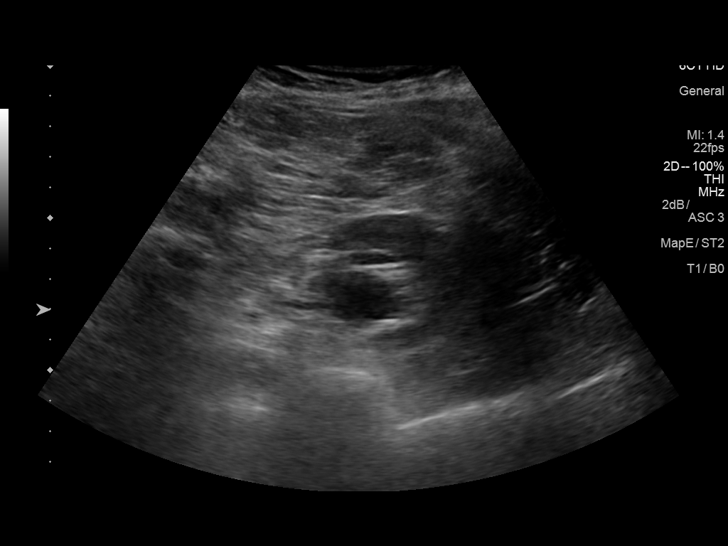
[im 108/108]
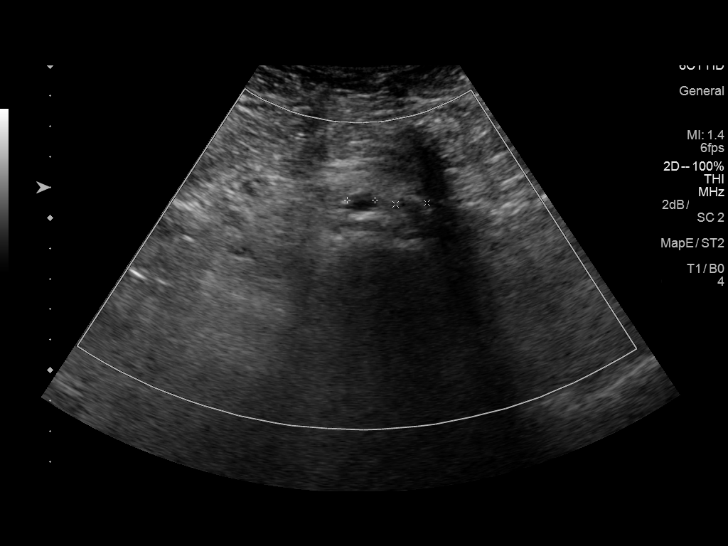

[13 of 25 positions shown; findings below may reference images not displayed]

FINDINGS: Gallbladder: There appears to be dense shadowing at the level of
gallbladder suggestive of stone filled gallbladder. No surrounding
fluid. Negative sonographic Murphy sign.

Common bile duct: Diameter: 7 mm

Liver: Normal hepatic contour. There are multiple septated cysts
within the liver measuring 1.5 x 1.1 x 1.2 cm in the left hepatic
lobe and 1.6 x 1.7 x 1.5 cm in the right hepatic lobe.

IVC: No abnormality visualized.

Pancreas: Visualized portion unremarkable.

Spleen: Size and appearance within normal limits.

Right Kidney: Length: 9.4 cm. Normal renal cortical thickness and
echogenicity. No hydronephrosis. Two cystic lesions within the right
kidney measuring up to 1.8 cm within the midpole.

Left Kidney: Length: 10.2 cm. No hydronephrosis. Normal renal
cortical thickness and echogenicity. Multiple cystic areas within
the left kidney. There is suggestion of possible associated
calcification/soft tissue within the lower pole cystic lesion
measuring up to 1 cm.

Abdominal aorta: No aneurysm visualized.

Other findings: None.
IMPRESSION: Findings suggestive of stone filled gallbladder without secondary
signs to suggest acute cholecystitis.

Common bile duct measures 7 mm.  Recommend correlation with LFTs.

Multiple cystic lesions within the kidneys bilaterally.

There is suggestion of possible calcification/soft tissue associated
with a lower pole cystic lesion in the left kidney. Recommend
further evaluation of this lesion with pre and post
contrast-enhanced MRI in the non acute setting.

## 2019-03-01 IMAGING — US US ABDOMEN LIMITED
1 series · 14 of 25 positions shown · non-contrast
Comparison: 01/11/2017

CLINICAL DATA: Epigastric pain with nausea and vomiting since
surgery 02/20/2017

EXAM:
ULTRASOUND ABDOMEN LIMITED RIGHT UPPER QUADRANT

[Series 1: us abdomen limited · 0.20mm/px · 14 of 63 slices shown]
[im 1/63]
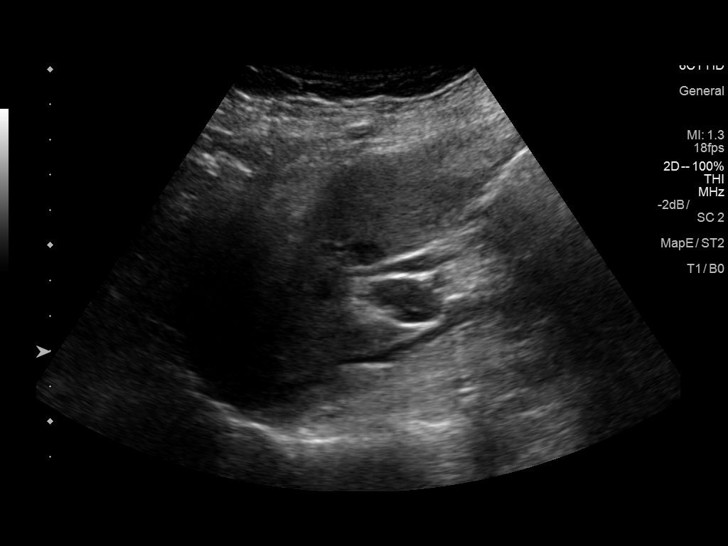
[im 6/63]
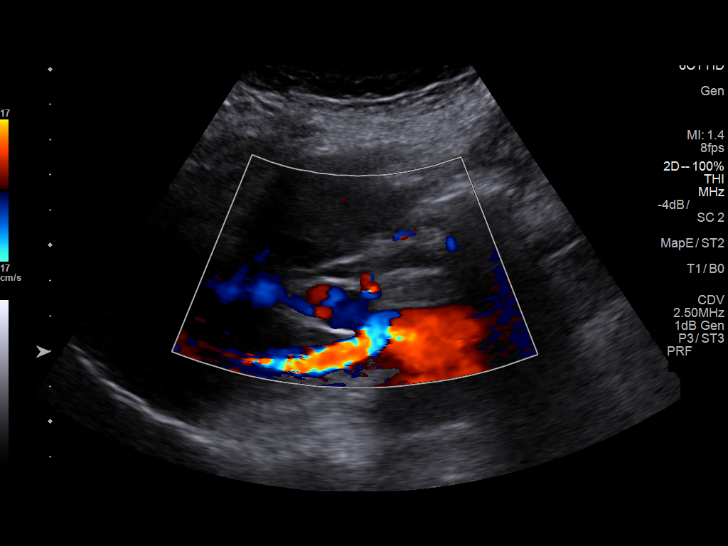
[im 11/63]
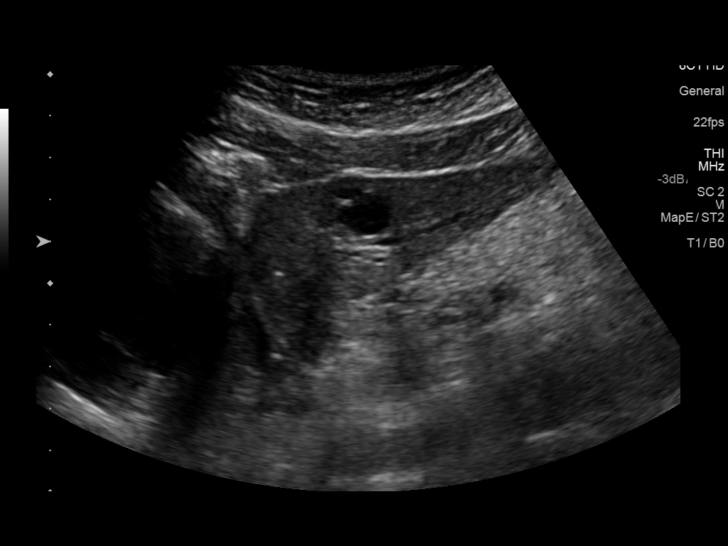
[im 16/63]
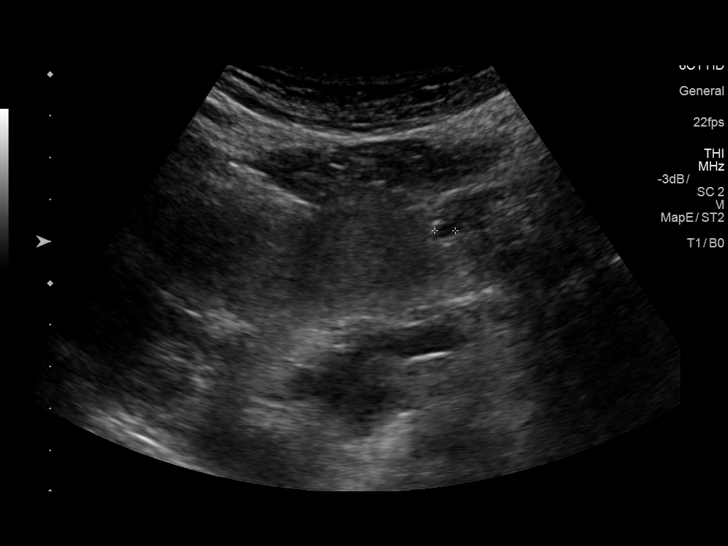
[im 21/63]
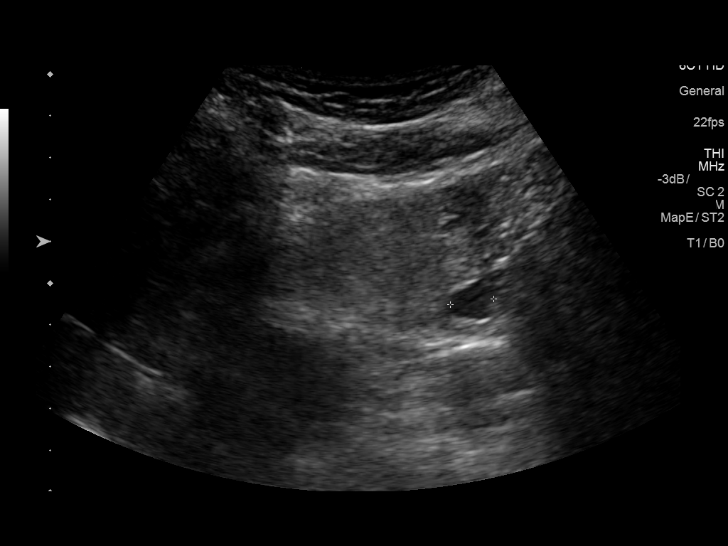
[im 24/63]
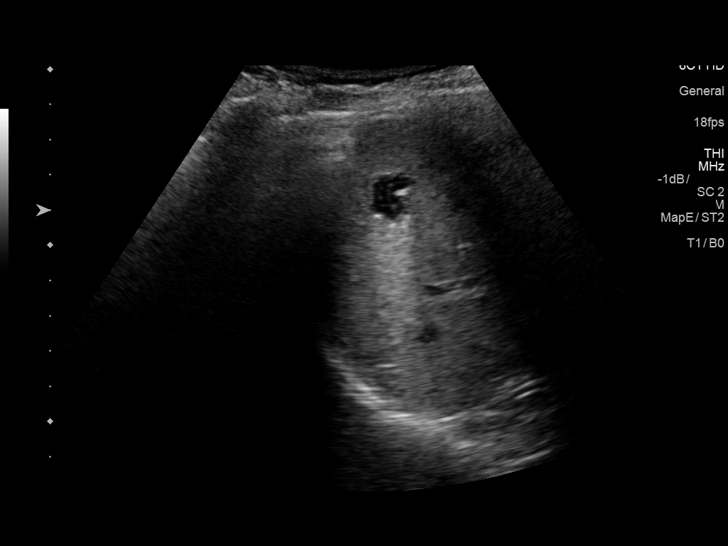
[im 29/63]
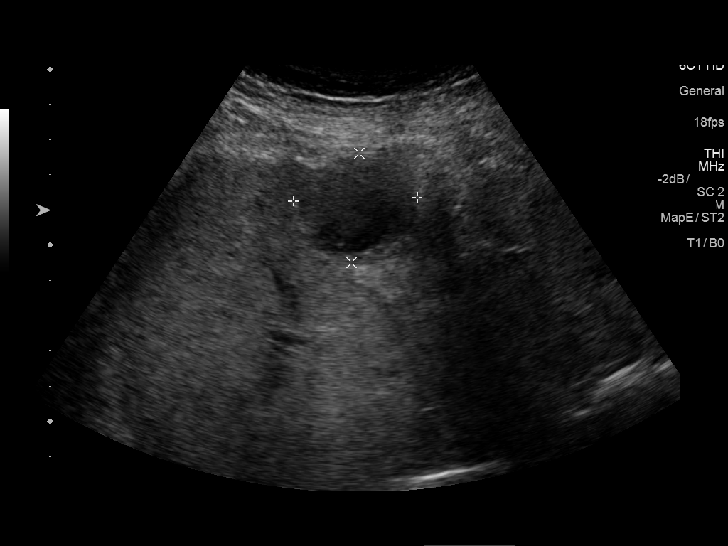
[im 34/63]
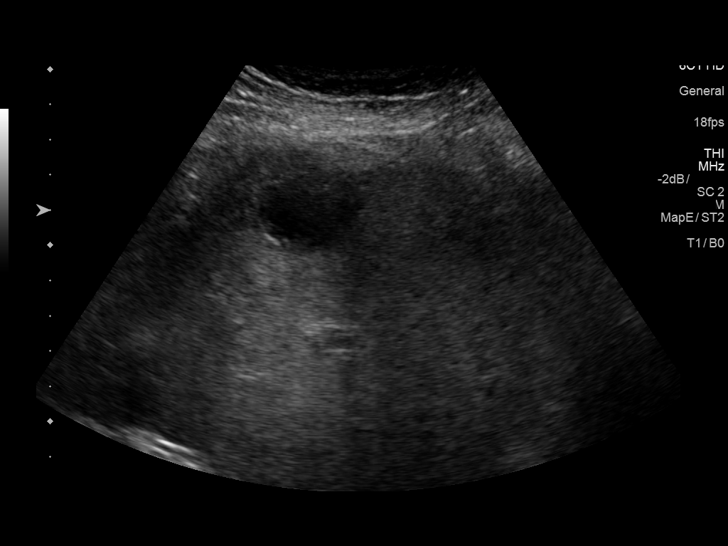
[im 39/63]
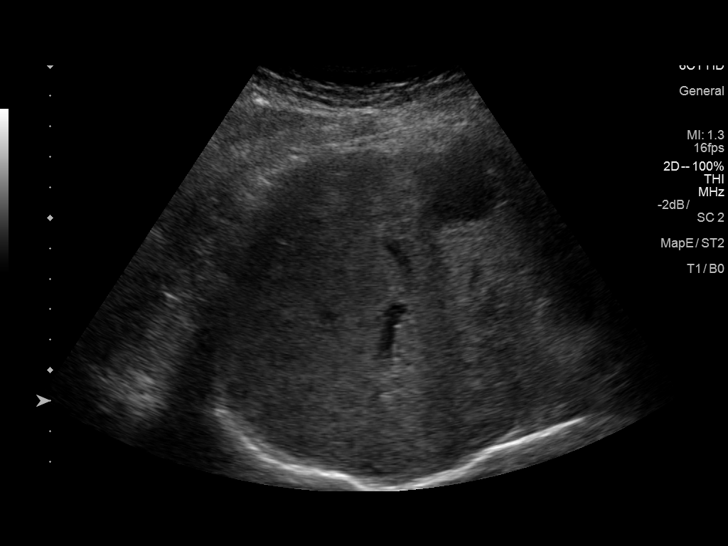
[im 42/63]
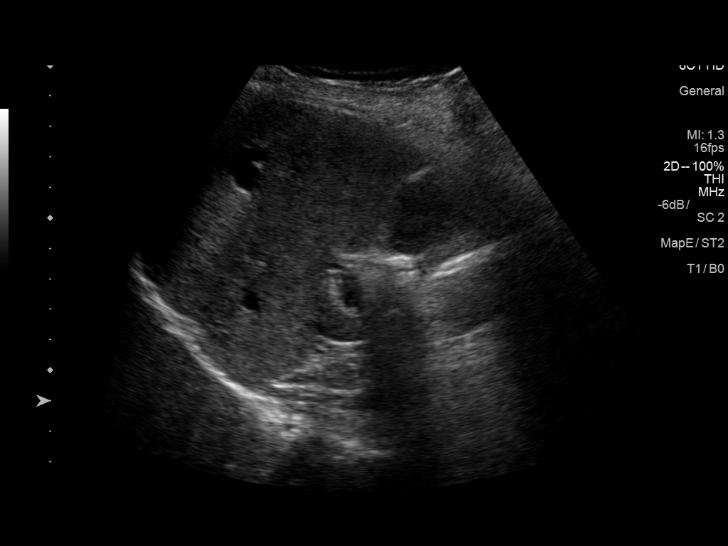
[im 47/63]
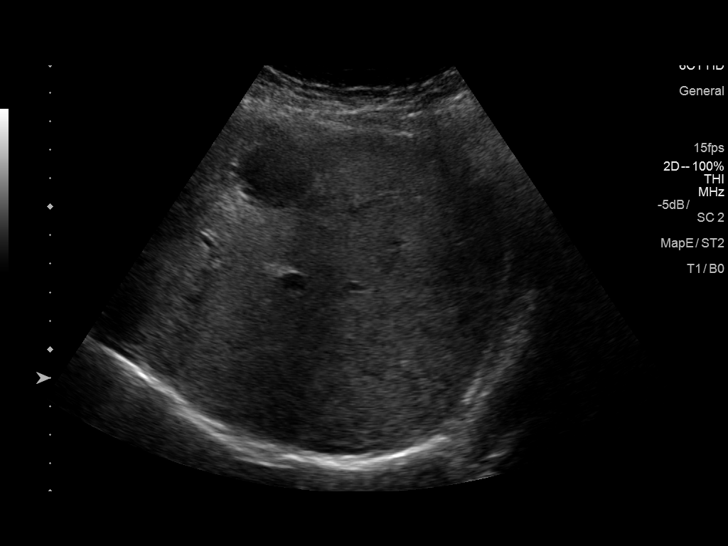
[im 52/63]
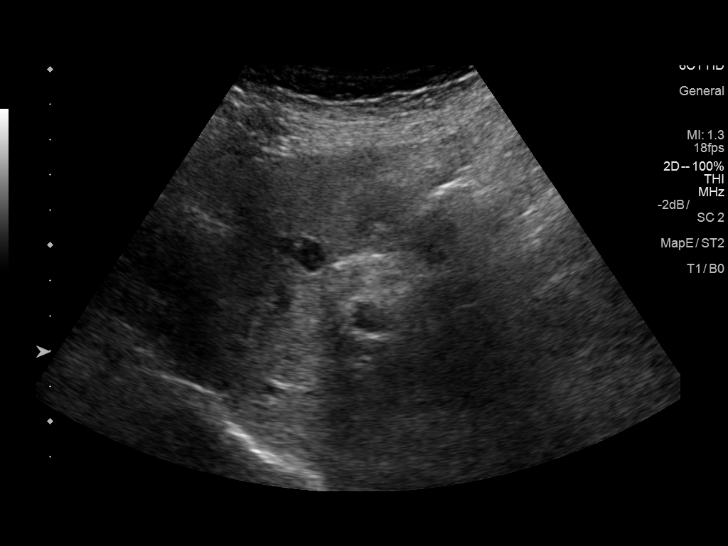
[im 57/63]
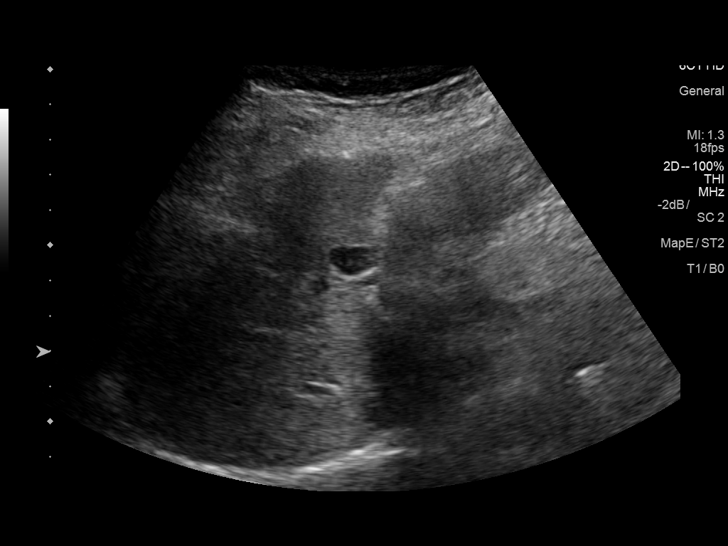
[im 63/63]
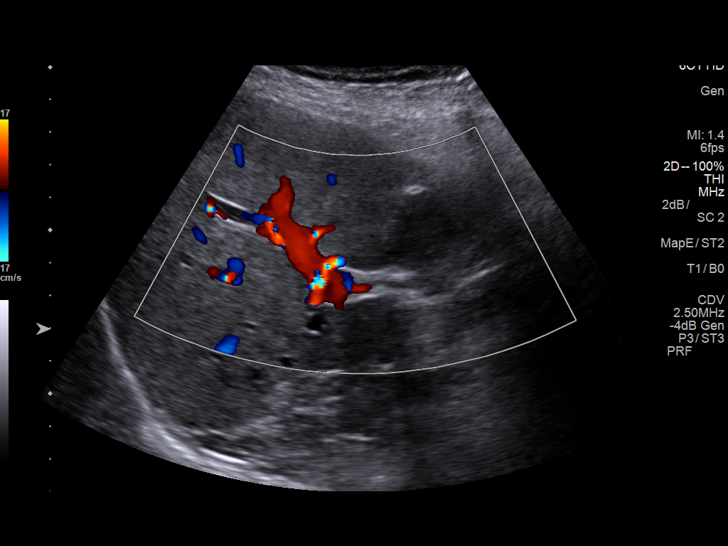

[14 of 25 positions shown; findings below may reference images not displayed]

FINDINGS: Gallbladder:

Surgically absent

Common bile duct:

Diameter: 7 mm, stable from prior. Where visualized, no filling
defect.

Liver:

Scattered liver cysts which were also seen previously, with
chronically seen septations. There is a new 3.5 cm cystic structure
in the gallbladder fossa or neighboring parenchyma with some
internal echoes. No solid mass noted. Antegrade flow in the main
portal vein.
IMPRESSION: 1. Interval cholecystectomy with new 3.5 cm cystic structure in the
gallbladder fossa or neighboring liver parenchyma. Consider CT with
contrast to evaluate for postoperative collection/biloma.
2. Cholecystectomy with stable common bile duct diameter of 7 mm. No
evidence of choledocholithiasis.
3. Hepatic cysts that were also seen previously.

## 2019-04-15 NOTE — Telephone Encounter (Signed)
 Itching is better after taking diflucan. Burning less but still has to void about every half hour. No fever, chills or nausea. I switched her to Macrobid 100 bid for a week. Continue drinking lots of fluids and cranberry juice.    Electronically signed by: Prentice Victory Blush, MD 04/15/19 [phone]

## 2019-09-02 ENCOUNTER — Ambulatory Visit: Payer: Medicare PPO | Attending: Internal Medicine

## 2019-09-02 DIAGNOSIS — Z23 Encounter for immunization: Secondary | ICD-10-CM

## 2019-09-02 NOTE — Progress Notes (Signed)
   Covid-19 Vaccination Clinic  Name:  Robin Fisher    MRN: [id number] DOB: Jan 30, 1941  09/02/2019  Robin Fisher was observed post Covid-19 immunization for 15 minutes without incidence. She was provided with Vaccine Information Sheet and instruction to access the V-Safe system.   Robin Fisher was instructed to call 911 with any severe reactions post vaccine: Marland Kitchen Difficulty breathing  . Swelling of your face and throat  . A fast heartbeat  . A bad rash all over your body  . Dizziness and weakness    Immunizations Administered    Name Date Dose VIS Date Route   Pfizer COVID-19 Vaccine 09/02/2019 10:31 AM 0.3 mL 07/24/2019 Intramuscular   Manufacturer: [employer], [employer]   Lot: [id number]   NDC: [address]-9

## 2019-09-21 ENCOUNTER — Ambulatory Visit: Payer: Medicare PPO | Attending: Internal Medicine

## 2019-09-21 DIAGNOSIS — Z23 Encounter for immunization: Secondary | ICD-10-CM

## 2019-09-21 NOTE — Progress Notes (Signed)
   Covid-19 Vaccination Clinic  Name:  Robin Fisher    MRN: [id number] DOB: [birth year]  09/21/2019  [name] was observed post Covid-19 immunization for 15 minutes without incidence. She was provided with Vaccine Information Sheet and instruction to access the V-Safe system.   [name] was instructed to call 911 with any severe reactions post vaccine: Marland Kitchen Difficulty breathing  . Swelling of your face and throat  . A fast heartbeat  . A bad rash all over your body  . Dizziness and weakness    Immunizations Administered    Name Date Dose VIS Date Route   Pfizer COVID-19 Vaccine 09/21/2019  3:53 PM 0.3 mL 07/24/2019 Intramuscular   Manufacturer: [employer], [employer]   Lot: [id number]   NDC: [address]-6

## 2020-05-08 NOTE — Telephone Encounter (Signed)
 Patient called to state that she gets recurrent yeast infections. Just now, she failed 7-day course of Monistat cream. A prescription for Diflucan 150 mg weekly as needed is called to her pharmacy. At her next visit, the will investigate for possible immunodeficiencies conditions such as diabetes. May also recommend vaginal estrogen cream on a routine basis. F.W.     Electronically signed by: Prentice Victory Blush, MD 05/08/20 [id number]

## 2020-07-20 NOTE — Progress Notes (Signed)
 Subjective:  In addition to the [facility] Wellness Visit, Robin Fisher is here for her annual CPE.  She has not had annual CPE's here before. SShe lost 25 pounds before the pandemic. Her weight had been up to 260. There are some skin lesions she is concerned about. A small hard lump on right forehead. Lipomas at both ankles now She gets a vaginal yeast infection about once a year for which Diflucan is effective. Dermatolgist prescribeed Diprolene for itchy spots. She uses Dove soap. She gets sores on breasts that reosolve spontaneously. She wakes 2 or 3 times to pee. Bowels function well. She has had all 3 Pfizer vaccines.    Current Outpatient Medications:  .  ascorbic acid, vitamin C, (VITAMIN C) 500 MG tablet, Take 500 mg by mouth., Disp: , Rfl:  .  b complex vitamins capsule, Take 1 capsule by mouth daily., Disp: , Rfl:  .  betamethasone dipropionate (DIPROSONE) 0.05 % cream, Apply to itchy areas of itchy skin twice a day as needed., Disp: 45 g, Rfl: 3 .  fluconazole (DIFLUCAN) 150 MG tablet, Take 1 tablet by mouth weekly as needed for yeast infections, Disp: 5 tablet, Rfl: 5 Allergies  Allergen Reactions  . Benadryl Allergy Decongestant Other (See Comments)    other  . Prednisone Other (See Comments)    unspecified  . Sulfa (Sulfonamide Antibiotics) Other (See Comments)    Unsure of reaction  . Penicillin Rash (ALLERGY/intolerance)   Immunizations by Immunization family    Pfizer SARS-CoV-2 Vaccine 09/02/2019 (78 y.o.) 09/21/2019 (78 y.o.) 05/08/2020 (79 y.o.)    Td (DECAVAC, TENIVAC) 08/15/2003 (79 y.o.)         Past Medical History:  Diagnosis Date  . Asthma   . Gallstones 03/19/2017  . Tinea corporis 06/15/2016   Past Surgical History:  Procedure Laterality Date  . APPENDECTOMY    . CHOLECYSTECTOMY  2019  . TONSILLECTOMY      Objective: BP 126/76   Pulse 71   Temp 98.4 F (36.9 C)   Resp 16   Ht 1.575 m (5' 2)   Wt 63.5 kg (140 lb)   LMP  (LMP Unknown)    SpO2 97%   BMI 25.61 kg/m   Wt Readings from Last 6 Encounters:  07/20/20 63.5 kg (140 lb)  02/13/20 61.2 kg (135 lb)  10/14/18 65.3 kg (144 lb)  03/20/17 66.7 kg (147 lb)  01/02/17 67.6 kg (149 lb)  06/15/16 68.9 kg (152 lb)   Physical exam:  GENERAL: Patient appears well.  She is well-dressed and groomed. She is in no apparent discomfort.  She is with her husband who also has a CPE today. DERM:  There is no rash.  There are multiple seborrheic keratoses.  There is a hard keratotic lesion on the right forehead without malignant features.  There are some benign moles and cherry hemangiomas.  There are soft subcutaneous masses at bilateral lateral ankles consistent with lipomas.  There are no lesions which appear malignant or pre-malignant.   HEENT:  There is no facial weakness.  External eyes and ears are normal.  PERRL. EOM's are intact. TM's and ear canals are normal.  Mouth and throat are normal. NECK: There is no mass, adenopathy or thyromegaly. CV: The heart has a regular rhythm and normal rate without murmurs.  The feet have good pulses without edema. RESP:  The lungs are clear to auscultation. GI: The abdomen is soft and non-tender to palpation. MSK:  There are no joint  deformities.   NEURO: Patient is alert and oriented.  Speech is clear. Cognitive function is normal. There are no focal deficits. PSYCH:  Affect is normal. She relates well.  Assessment:   1. Encounter for Medicare annual wellness exam    2. Health maintenance examination  CBC and Differential   Lipid Profile   TSH, 3rd Generation   Comprehensive Metabolic Panel   Vitamin D1,25 Dihydr (Sendout)     Plan:   Patient Instructions    The 4 Principles of Healthy Living Do Not Smoke.  Maintain a BMI<30.  Exercise 150 minutes/week. (30 minutes 5 days a week of some form of aerobic exercise)  Eat 5 servings of fruits or vegetables daily.   Several studies have conclusively shown that individuals who do  these things have a dramatic reduction in overall mortality, heart disease, diabetes, hypertension, stroke, congestive heart failure, and cancer.  This means that without taking any pills, vitamins, tonics, etc - without spending a single penny - you can live a longer, healthier, more productive life.  Let's look at these 4 principles separately.   1. Do Not Smoke - Make up your mind to quit, talk with your doctor to formulate a plan, and set a date.  2. Get to and maintain a BMI<30.  This gets you out of the obese category.  No longer being obese will dramatically reduce you and your family's risk of a multitude of health problems.  It is not easy, but, it is possible.  If you are extremely obese it may take years, but, each step you take will lead to dramatic rewards.  With just 20 pounds of weight loss most people feel better, have less fatigue and joint pain, and feel more energetic.  If you have health problems like diabetes, hypertension, or high cholesterol you might do away with your need for some medications.  And most of all, while you change you lifestyle to attain this goal you will set a good example for all those around you, especially your children.  Here are two proven steps to help you start losing weight. Portion Control - Eating your meals on a smaller plate and limiting the amount of calories you eat at each meal has been shown to lead to weight loss. The average plate is 10 inches in diameter.  A 10 inch plate piled high with food can add up to 1500 calories (even more if you go back for seconds).  The typical man needs 2000 calories per day total.  Try using a smaller plate (8 inch paper plate or 7 inch saucer) at each meal and NEVER go back for second helpings. Pedometer - individuals who wear a pedometer and try to walk 10,000 steps each day increase their physical activity, lose weight, and decrease their blood pressures.  3. Exercise 150 minutes/week.  The overall health benefits of  regular aerobic exercise are overwhelming:  Reduces the risk of dying prematurely. Reduces the risk of dying from heart disease.  Reduces the risk of stroke.  Reduces the risk of developing diabetes.  Reduces the risk of developing high blood pressure.  Helps reduce blood pressure in people who already have high blood pressure.  Reduces the risk of developing colon cancer.  Reduces feelings of depression and anxiety.  Helps control weight.  Helps build and maintain healthy bones, muscles and joints.  Helps older adults become stronger and better able to move about without falling.  Promotes psychological well-being.    If  you have health problems or are over age 48 we recommend consulting your doctor before beginning.  We recommend starting slow and working up.  Start by reading the Healthnote on Starting an Exercise Program then get going.  Do not over think the process.  Pick something simple at home like walking with friends, using a treadmill, or riding an exercise bike.  Experiment with different types of exercise until you find something you can tolerate (it does not have to be fun).  Pick a 30 minute disc of inspirational music and listen to it while you exercise.  The more you do it the easier it will become and the better you will feel.  4. Eat 5 servings of fruits or vegetables daily.  A serving size is: One medium-size fruit  1/2 cup raw, cooked, frozen or canned fruits (in 100% juice) or vegetables  3/4 cup (6 oz.) 100% fruit or vegetable juice  1 cup raw, leafy vegetables  1/4 cup dried fruit   While this sounds easy enough actually getting this much fruits and vegetables takes some work and planning.  You will need to experiment with different types of fruits and vegetables to find ones you and your family can eat every day.  Some simple tips include:   - Add fruit to your cereal each morning.  - Eat a salad each day for lunch.  The typical bowl of salad counts for 2  servings of vegetables.    - Have some fruits and vegetables at every meal.  Use canned or frozen products if needed.  - Eat fruits and vegetables for snacks - especially for the kids.  - Replace the side of fries or chips with a cup of fruit, an apple, or a bowl of celery.  What are the benefits? Reduces heart disease and stroke. Possible reduction in cancer risk. Protects against the development of diabetes. Filling up on fat free fruits and vegetable decreases the amount of high fat foods you will eat, aiding in weight loss.  Health Maintenance Status      Date Due Completion Dates   INFLUENZA VACCINE Never done ---   Medicare Annual Wellness Visit 07/20/2021 07/20/2020, 07/20/2020 (Done)      Health Maintenance Modifiers        Hep C Screening   Medicare Wellness Visit      Immunization History  Administered Date(s) Administered  . Pfizer SARS-CoV-2 Vaccine 09/02/2019, 09/21/2019, 05/08/2020  . Td [facility], TENIVAC) 08/15/2003    Advance Care Planning Workshops  - Living Will & Healthcare Power of Attorney ([email or web address])  We plan for college, marriage, a baby, & retirement... But we don't prepare for the end of life.  Let's change that!  Making a plan is as simple as 1-2-3.  Meetings are held Tuesdays at 1:00pm or 4:00pm:  [place]: First Tuesday every month  [facility] [address] [facility]: Second Tuesday every month [facility] - [address]  [place]: Third Tuesday every month Marcey RONAL [facility] - [address]  [place]: Fourth Tuesday every month [facility] - [address] - Lower level of  [place] [facility] [address]. 1:00pm workshop only   To Register for a Meeting: Call [phone], ext. [phone] to register for [place], [place], or [place]. Call [phone] to register for [place]. You can also register online at [email or web address].  There is no  charge for this service, however donations are accepted.  Additional Advanced Directives resources for other communities: List of agencies that provide Notary services to assist in completing documents.  [facility]: [facility] [place]:  [phone] Legal Aid of [place]:  [phone] [facility] Association: [phone]  [address] [place], [place] [address]  [place]: [facility] Dept:  [phone] [facility] [place] (Need to bring your own witnesses):  [phone]  Legal Aid of [place]:  [phone]   [facility]:  [facility] office):  [phone]  Legal Aid of [place]:  [phone]  [employer] Services:  [phone]   [place]:  [facility]:  [phone]  [facility]:  [phone]  [facility]:  [phone]   [facility]:  Advance Care Directives Clinic at the [facility]  [address], [place], [place] [address]  For more information, call [facility] at [phone]  Second Thursday every month at 1:30 pm.     [facility]   Call [phone] to set up an appointment.   HEALTHCARE MAINTENANCE: Continue healthy and active lifestyle. Continue mild soap and moisturizers as needed. Continue regimen to prevent vaginal yeast infections. Okay to use Diflucan if yeast infections do occur. Recommend annual flu shots-declined. Even though you have had full immunization against COVID-19, continue staying safe by wearing masks and distancing.  We will contact you with results of labs drawn today.   ADDENDUM: Lab results from recent office visit are reviewed: All are okay. Blood cell count is normal except for slight anemia which is unchanged from 3 years ago. Kidney function is okay, improved from last time it was checked. Liver function tests are normal. Cholesterol is  slightly elevated but not in a bad way. Thyroid test indicates that there may be some impending hypothyroidism. Recommend rechecking labs in 1 year.  Return in 1 year (on 07/20/2021) for medicare wellness visit (MWV) and CPE and labs.SABRA  [facility] Initial Annual Wellness Visit  Name: Robin Fisher Date of Birth: July 20, 1941 Age: 79 y.o. 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 Does the patient have a support system (family, friend, church, Conservation officer, nature, etc)?: Yes Patient rates overall health status as: very good Patient rates overall stress level as: Some Does stress affect daily life?: No Typical amount of pain: none Does pain affect daily life?: No  Opioid Use Are you currently prescribed opioids?: No                Depression Screening    Over the past two weeks, how often have you been bothered by little interest or pleasure in doing things?: 0 - Not at All Over the past two weeks how often have you been bothered by feeling down, depressed, or hopeless?: 0 - Not at All  I have reviewed the depression screening results and agree with the interpretation.  Social History (Tobacco/Alcohol/Drugs/Sexual Activity)  reports that she has never smoked. She has never used smokeless tobacco. Tobacco Use?: No Number of alcohol drinks per week: 0   How many times in the past year have you had 4 or more drinks in a day?: None How many times in the past year have you used a recreational drug or used a prescription medication for nonmedical reasons?:  None Risk factors for sexually transmitted infections (i.e., multiple sexual partners): No  Physical Activity Regular exercise?: (!) No      Diet How many meals a day?:  2 Eats fruit and vegetables daily?: Yes Most meals are obtained by: preparing their own meals,eating out,having others provide food  Home and Transportation Safety All rugs have non-skid backing?: (!) No All stairs or steps have railings?: (!) No Grab bars in the bathtub or shower?: Yes Have non-skid surface in bathtub or shower?: Yes Good home lighting?: Yes Regular seat belt use?: Yes  Activities of Daily Living Feed self?: Yes Bathe self?: Yes Dress self?: Yes Use toilet without assistance?: Yes Walk without assistance?: Yes    Instrumental Activities of Daily Living Manage finances?: Yes Shop for themselves?: Yes Prepare meals?: Yes Use the telephone?: Yes Manage medications?: Yes   Performs basic housework/laundry?: Yes Drives?: Yes Primary transportation is: driving  Hearing Concerns about hearing?: No   Hear whispered voice? (Observed): Yes  Vision Concerns about vision?: No  Fall Risk Fall risk screening result?: Negative Can patient rise from a chair without using arms? (Observed): Yes How many falls have you had in the last year?: 0   Cognitive Assessment Has a diagnosis of dementia or cognitive impairment?: No Are there any memory concerns by the patient, others, or providers?: No      Advance Directives Living will?: (!) No Healthcare POA?: (!) No        Who is your in case of emergency contact?: Robin Fisher Relationship to patient: Spouse Emergency contact's phone number: [phone]  Advance directive information provided to the patient.  Other History I reviewed and updated the following risk factors and conditions as appropriate: Problem List, Medical History, Surgical History, Family History, Medications, Allergies  Vital Signs BP 126/76   Pulse 71   Temp 98.4 F (36.9 C)   Resp 16   Ht 1.575 m (5' 2)   Wt 63.5 kg (140 lb)   LMP  (LMP Unknown)   SpO2 97%   BMI 25.61 kg/m    Screening and Immunizations Health Maintenance  Status      Date Due Completion Dates   Medicare Annual Wellness Visit 07/20/2021 07/20/2020, 07/20/2020 (Done)   Comment on 07/20/2020: IAWV      Immunization History  Administered Date(s) Administered  . Pfizer SARS-CoV-2 Vaccine 09/02/2019, 09/21/2019, 05/08/2020  . Td [facility], TENIVAC) 08/15/2003    Assessment/Plan:   Initial Annual Wellness Visit: The topics above were reviewed with the patient.  Healthy lifestyle principles reviewed.  Recommendations provided when indicated.  Follow up 1 year for next wellness visit.   Orders Placed This Encounter  Procedures  . CBC and Differential  . Lipid Profile  . TSH, 3rd Generation  . Comprehensive Metabolic Panel  . Vitamin D1,25 Dihydr (Sendout)    Orders Placed This Encounter  Medications  . fluconazole (DIFLUCAN) 150 MG tablet    Sig: Take 1 tablet by mouth weekly as needed for yeast infections    Dispense:  5 tablet    Refill:  5  . betamethasone dipropionate (DIPROSONE) 0.05 % cream    Sig: Apply to itchy areas of itchy skin twice a day as needed.    Dispense:  45 g    Refill:  3    Patient Care Team: Prentice Victory Blush, MD as PCP - General (Family Medicine)   Electronically signed by: Prentice Victory Blush, MD 07/31/2020 4:21 PM    Electronically signed by: Prentice Victory  Tanda, MD 07/31/20 1622

## 2020-07-31 NOTE — Telephone Encounter (Signed)
  On-call note: Sunday night. Patient states that for about 3 days she has had urinary frequency and burning. She has had a little bit of upset stomach but no vomiting. There is no fever. There is no back pain. She has been drinking cranberry juice and drinking extra water. Macrobid is sent to pharmacy 100 mg twice a day for 1 week. Continue drinking a lot of fluids.   Let us  know if this does not solve the problem.   Electronically signed by: Prentice Victory Blush, MD 07/31/20 [phone]

## 2021-05-22 ENCOUNTER — Ambulatory Visit: Payer: Medicare PPO | Attending: Internal Medicine

## 2021-05-22 DIAGNOSIS — Z23 Encounter for immunization: Secondary | ICD-10-CM

## 2021-05-22 NOTE — Progress Notes (Signed)
   Covid-19 Vaccination Clinic  Name:  Robin Fisher    MRN: [id number] DOB: [birth year]  05/22/2021  Robin Fisher was observed post Covid-19 immunization for 15 minutes without incident. She was provided with Vaccine Information Sheet and instruction to access the V-Safe system.   Robin Fisher was instructed to call 911 with any severe reactions post vaccine: Difficulty breathing  Swelling of face and throat  A fast heartbeat  A bad rash all over body  Dizziness and weakness

## 2021-09-14 NOTE — Progress Notes (Signed)
 [facility] Subsequent Annual Wellness Visit  Name: Robin Fisher Date of Birth: 02-Apr-1941 Age: 81 y.o. 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 Does the patient have a support system (family, friend, church, Conservation officer, nature, etc)?: Yes Patient rates overall health status as: very good Patient rates overall stress level as: Some Does stress affect daily life?: No Typical amount of pain: none Does pain affect daily life?: No  Opioid Use Are you currently prescribed opioids?: No                Depression Screening    Over the past two weeks, how often have you been bothered by little interest or pleasure in doing things?: 0 - Not at All Over the past two weeks how often have you been bothered by feeling down, depressed, or hopeless?: 0 - Not at All  I have reviewed the depression screening results and agree with the interpretation.  Social History (Tobacco/Alcohol/Drugs/Sexual Activity)  reports that she has never smoked. She has never used smokeless tobacco. Tobacco Use?: No Number of alcohol drinks per week: 0   How many times in the past year have you had 4 or more drinks in a day?: None How many times in the past year have you used a recreational drug or used a prescription medication for nonmedical reasons?: None Risk factors for sexually transmitted infections (i.e., multiple sexual partners): No  Physical Activity Regular exercise?: Yes Exercise frequency (times per week): 5 Exercise intensity: light (like slow walking)  Diet How many meals a day?: 3 Eats fruit and vegetables daily?: Yes Most meals are obtained by: preparing their own meals, eating out, having others provide food  Home and  Transportation Safety All rugs have non-skid backing?: (!) No All stairs or steps have railings?: (!) No Grab bars in the bathtub or shower?: Yes Have non-skid surface in bathtub or shower?: Yes Good home lighting?: Yes Regular seat belt use?: Yes  Activities of Daily Living Feed self?: Yes Bathe self?: Yes Dress self?: Yes Use toilet without assistance?: Yes Walk without assistance?: Yes    Instrumental Activities of Daily Living Manage finances?: Yes Shop for themselves?: Yes Prepare meals?: Yes Use the telephone?: Yes Manage medications?: Yes   Performs basic housework/laundry?: Yes Drives?: Yes Primary transportation is: driving  Hearing Concerns about hearing?: No Uses hearing aids?: No Hear whispered voice? (Observed): Yes  Vision Concerns about vision?: No  Fall Risk Fall risk screening result?: Negative Can patient rise from a chair without using arms? (Observed): Yes How many falls have you had in the last year?: 0   Cognitive Assessment Has a diagnosis of dementia or cognitive impairment?: No Are there any memory concerns by the patient, others, or providers?: No      Advance Directives Living will?: (!) No Healthcare POA?: (!) No        Who is your in case of emergency contact?: Alm Bou Relationship to patient: Spouse Emergency contact's phone number: [phone]  Advance directive information provided to the patient.  Other History I reviewed and updated the following risk factors and conditions as appropriate: Problem List, Medical History, Surgical History, Family History, Medications, Allergies  Vital Signs BP 113/66   Pulse 68   Temp 98.1 F (36.7 C)   Resp 16   Ht 1.575 m (5' 2)  Wt 65.4 kg (144 lb 3.2 oz)   LMP  (LMP Unknown)   SpO2 98%   BMI 26.37 kg/m    Screening and Immunizations Health Maintenance Status      Date Due Completion Dates   INFLUENZA VACCINE Never done ---   Medicare Annual Wellness Visit  09/14/2022 09/14/2021, 09/14/2021 (Done)   Comment on 09/14/2021: SAWV   Comment on 07/20/2020: IAWV      Immunization History  Administered Date(s) Administered  . PFIZER 12 YEARS & UP SARS-COV-2 VACCINE 12/09/2020  . PFIZER BOOSTER BIVALENT 12 YEARS & UP SARS-COV-2 VACCINE 05/22/2021  . Purple [place] Pfizer SARS-CoV-2 Vaccine 09/02/2019, 09/21/2019, 05/08/2020  . Td [facility], TENIVAC) 08/15/2003    Assessment/Plan:   Subsequent Annual Wellness Visit: The topics above were reviewed with the patient.  Healthy lifestyle principles reviewed.  Recommendations provided when indicated.  Follow up 1 year for next wellness visit.   No orders of the defined types were placed in this encounter.   No orders of the defined types were placed in this encounter.   Patient Care Team: Prentice Victory Blush, MD as PCP - General (Family Medicine)   Electronically signed by: Prentice Victory Blush, MD 09/14/2021 4:03 PM    Electronically signed by: Prentice Victory Blush, MD 09/24/21 2237

## 2021-09-24 NOTE — Progress Notes (Addendum)
 Subjective:  In addition to the [facility] Wellness Visit, Robin Fisher is here for her annual CPE.  In general, she feels well. She has found a way to prevent yeast infections which has to do with type of clothing. She has some soreness of the right chest. She notes roiling of the stomach after eating without pain. Sometimes her bowel movements are hard. She has had full immunization against COVID-19 but has declined other vaccines.   Current Outpatient Medications:  .  ascorbic acid, vitamin C, (VITAMIN C) 500 MG tablet, Take 1 tablet (500 mg total) by mouth., Disp: , Rfl:  .  fluconazole (DIFLUCAN) 150 MG tablet, Take 1 tablet by mouth weekly as needed for yeast infections, Disp: 5 tablet, Rfl: 5 .  albuterol 90 mcg/actuation inhaler, One or 2 inhalations every 4 hours as needed for wheezing, Disp: 6.7 g, Rfl: 1 .  betamethasone dipropionate (DIPROSONE) 0.05 % cream, Apply to itchy areas of itchy skin twice a day as needed., Disp: 45 g, Rfl: 3 Allergies  Allergen Reactions  . Benadryl Allergy Decongestant Other (See Comments)    other  . Prednisone Other (See Comments)    unspecified  . Sulfa (Sulfonamide Antibiotics) Other (See Comments)    Unsure of reaction  . Penicillin Rash (ALLERGY/intolerance)   Immunizations by Immunization family    PFIZER 12 YEARS & UP SARS-COV-2 VACCINE 12/09/2020 (81 y.o.)      PFIZER BOOSTER BIVALENT 12 YEARS & UP SARS-COV-2 VACCINE 05/22/2021 (80 y.o.)      [employer] SARS-CoV-2 Vaccine 09/02/2019 (81 y.o.) 09/21/2019 (81 y.o.) 05/08/2020 (81 y.o.)    Td (DECAVAC, TENIVAC) 08/15/2003 (81 y.o.)         Past Medical History:  Diagnosis Date  . Asthma   . Gallstones 03/19/2017  . Tinea corporis 06/15/2016   Past Surgical History:  Procedure Laterality Date  . APPENDECTOMY    . CHOLECYSTECTOMY  2019  . TONSILLECTOMY     Objective: BP 113/66   Pulse 68   Temp 98.1 F (36.7 C)   Resp 16   Ht 1.575 m (5' 2)   Wt 65.4 kg (144 lb 3.2 oz)    LMP  (LMP Unknown)   SpO2 98%   BMI 26.37 kg/m   Wt Readings from Last 8 Encounters:  09/14/21 65.4 kg (144 lb 3.2 oz)  07/20/20 63.5 kg (140 lb)  02/13/20 61.2 kg (135 lb)  10/14/18 65.3 kg (144 lb)  03/20/17 66.7 kg (147 lb)  01/02/17 67.6 kg (149 lb)  06/15/16 68.9 kg (152 lb)   Physical exam:  GENERAL: Patient appears well.  She is well-dressed and groomed. She is in no apparent discomfort.  She is with her husband who also has a CPE today. DERM:  There is no rash.  There are multiple seborrheic keratoses.  There is a hard keratotic lesion on the right forehead without malignant features.  There are some benign moles and cherry hemangiomas.  There are soft subcutaneous masses at bilateral lateral ankles consistent with lipomas.  There are no lesions which appear malignant or pre-malignant.   HEENT:  There is no facial weakness.  External eyes and ears are normal.  PERRL. EOM's are intact. TM's and ear canals are normal.  Mouth and throat are normal. NECK: There is no mass or adenopathy.  The thyroid is not enlarged. CV: The heart has a regular rhythm and normal rate without murmurs.  The feet have good pulses without edema. RESP:  The  lungs are clear to auscultation. GI: The abdomen is soft and non-tender to palpation. MSK:  There are no joint deformities.  There slight tenderness of the pectoralis muscles.  There are no lumps or lesions on the chest or outer breasts. NEURO: Patient is alert and oriented.  Speech is clear. Cognitive function is normal. There are no focal deficits. PSYCH:  Affect is normal. She relates well.  Assessment:   1. Encounter for Medicare annual wellness exam  CBC and Differential   Comprehensive Metabolic Panel   TSH, 3rd Generation   Free Thyroxine (Free T4)   CBC and Differential   Comprehensive Metabolic Panel   TSH, 3rd Generation   Free Thyroxine (Free T4)    2. Health maintenance examination      3. Stage 3a chronic kidney disease (HCC)       4. Chronic insomnia        Plan:   Patient Instructions    The 4 Principles of Healthy Living Do Not Smoke.  Maintain a BMI<30.  Exercise 150 minutes/week. (30 minutes 5 days a week of some form of aerobic exercise)  Eat 5 servings of fruits or vegetables daily.   Several studies have conclusively shown that individuals who do these things have a dramatic reduction in overall mortality, heart disease, diabetes, hypertension, stroke, congestive heart failure, and cancer.  This means that without taking any pills, vitamins, tonics, etc - without spending a single penny - you can live a longer, healthier, more productive life.  Let's look at these 4 principles separately.   1. Do Not Smoke - Make up your mind to quit, talk with your doctor to formulate a plan, and set a date.  2. Get to and maintain a BMI<30.  This gets you out of the obese category.  No longer being obese will dramatically reduce you and your family's risk of a multitude of health problems.  It is not easy, but, it is possible.  If you are extremely obese it may take years, but, each step you take will lead to dramatic rewards.  With just 20 pounds of weight loss most people feel better, have less fatigue and joint pain, and feel more energetic.  If you have health problems like diabetes, hypertension, or high cholesterol you might do away with your need for some medications.  And most of all, while you change you lifestyle to attain this goal you will set a good example for all those around you, especially your children.  Here are two proven steps to help you start losing weight. Portion Control - Eating your meals on a smaller plate and limiting the amount of calories you eat at each meal has been shown to lead to weight loss. The average plate is 10 inches in diameter.  A 10 inch plate piled high with food can add up to 1500 calories (even more if you go back for seconds).  The typical man needs 2000 calories per day total.   Try using a smaller plate (8 inch paper plate or 7 inch saucer) at each meal and NEVER go back for second helpings. Pedometer - individuals who wear a pedometer and try to walk 10,000 steps each day increase their physical activity, lose weight, and decrease their blood pressures.  3. Exercise 150 minutes/week.  The overall health benefits of regular aerobic exercise are overwhelming:  Reduces the risk of dying prematurely. Reduces the risk of dying from heart disease.  Reduces the risk of stroke.  Reduces the risk of developing diabetes.  Reduces the risk of developing high blood pressure.  Helps reduce blood pressure in people who already have high blood pressure.  Reduces the risk of developing colon cancer.  Reduces feelings of depression and anxiety.  Helps control weight.  Helps build and maintain healthy bones, muscles and joints.  Helps older adults become stronger and better able to move about without falling.  Promotes psychological well-being.    If you have health problems or are over age 14 we recommend consulting your doctor before beginning.  We recommend starting slow and working up.  Start by reading the Healthnote on Starting an Exercise Program then get going.  Do not over think the process.  Pick something simple at home like walking with friends, using a treadmill, or riding an exercise bike.  Experiment with different types of exercise until you find something you can tolerate (it does not have to be fun).  Pick a 30 minute disc of inspirational music and listen to it while you exercise.  The more you do it the easier it will become and the better you will feel.  4. Eat 5 servings of fruits or vegetables daily.  A serving size is: One medium-size fruit  1/2 cup raw, cooked, frozen or canned fruits (in 100% juice) or vegetables  3/4 cup (6 oz.) 100% fruit or vegetable juice  1 cup raw, leafy vegetables  1/4 cup dried fruit   While this sounds easy enough actually  getting this much fruits and vegetables takes some work and planning.  You will need to experiment with different types of fruits and vegetables to find ones you and your family can eat every day.  Some simple tips include:   - Add fruit to your cereal each morning.  - Eat a salad each day for lunch.  The typical bowl of salad counts for 2 servings of vegetables.    - Have some fruits and vegetables at every meal.  Use canned or frozen products if needed.  - Eat fruits and vegetables for snacks - especially for the kids.  - Replace the side of fries or chips with a cup of fruit, an apple, or a bowl of celery.  What are the benefits? Reduces heart disease and stroke. Possible reduction in cancer risk. Protects against the development of diabetes. Filling up on fat free fruits and vegetable decreases the amount of high fat foods you will eat, aiding in weight loss.  Health Maintenance Status      Date Due Completion Dates   INFLUENZA VACCINE Never done ---   Medicare Annual Wellness Visit 09/14/2022 09/14/2021, 09/14/2021 (Done)      Health Maintenance Modifiers        Hep C Screening   Medicare Wellness Visit      Immunization History  Administered Date(s) Administered  . PFIZER 12 YEARS & UP SARS-COV-2 VACCINE 12/09/2020  . PFIZER BOOSTER BIVALENT 12 YEARS & UP SARS-COV-2 VACCINE 05/22/2021  . [employer] SARS-CoV-2 Vaccine 09/02/2019, 09/21/2019, 05/08/2020  . Td [facility], TENIVAC) 08/15/2003    Advance Care Planning Workshops  - Living Will & Healthcare Power of Attorney ([email or web address])  We plan for college, marriage, a baby, & retirement... But we don't prepare for the end of life.  Let's change that!  Making a plan is as simple as 1-2-3.  Meetings are held Tuesdays at 1:00pm or 4:00pm:  [place]: First Tuesday every month  [facility] [address]  [place]: Second Tuesday every month [facility] - [address]  Home: Third Tuesday every month Marcey RONAL [facility] - [address]  [place]: Fourth Tuesday every month [facility] - [address] - Lower level of  [place] [facility] Bld[address]. 1:00pm workshop only   To Register for a Meeting: Call [phone], ext. [phone] to register for [place], [place], or [place]. Call [phone] to register for [place]. You can also register online at [email or web address].  There is no charge for this service, however donations are accepted.   Additional Advanced Directives resources for other communities: List of agencies that provide Notary services to assist in completing documents.  [facility]: [facility]:  [phone] Legal Aid of [place]:  [phone] [facility] Senior [facility] Association: [phone]  [address], [place], [place] [address]  [place]: [facility] Spiritual Care Dept:  [phone] [facility] [place] (Need to bring your own witnesses):  [phone]  Legal Aid of [place]:  [phone]   [facility]:  [facility] (Chaplain office):  [phone]  Legal Aid of [place]:  [phone]  [employer] Services:  [phone]   [place]:  [facility]:  [phone]  [facility]:  [phone]  [facility]:  [phone]   [facility]   Call [phone] to set up an appointment  Return in 1 year (on 09/14/2022) for medicare wellness visit ([place]) and CPE.   Electronically signed by: Prentice Victory Blush, MD 09/24/21 2237    Electronically signed by: Prentice Victory Blush, MD 10/06/21 [phone]

## 2022-10-21 NOTE — Progress Notes (Signed)
 Positive for E. Coli;  pt on appropriate abx therapy; macrobid

## 2022-10-23 NOTE — Telephone Encounter (Addendum)
 Pt returned call, full name & dob verified.   Results & recommendations reviewed, pt verbalized understanding.    ----- Message from Arlyne Almarie Albino, PA-C sent at 10/21/2022 11:21 PM EDT ----- Positive for E. Coli;  pt on appropriate abx therapy; macrobid

## 2022-11-15 NOTE — Progress Notes (Signed)
 Subjective:  In addition to Medicare wellness visit, Robin Fisher is here for her annual physical exam.   A year ago:  In general, she feels well. She has had some soreness of the right chest that comes and goes.   Also one or the other shoulder hurts temporarily. She has had full immunization against COVID-19 but has declined other vaccines.  In general, she does not want medical interventions.  She had Covid for two weeks and she finally got over it about a week ago. There is a frequent cough or throat clearing. She ate lunch about 3 hours ago.    Current Outpatient Medications:  .  albuterol HFA (PROVENTIL HFA;VENTOLIN HFA;PROAIR HFA) 90 mcg/actuation inhaler, INHALE 1-2 PUFFS EVERY 4 HOURS AS NEEDED FOR WHEEZING, Disp: 8.5 each, Rfl: 1 .  ascorbic acid (VITAMIN C) 500 mg tablet, Take 500 mg by mouth., Disp: , Rfl:  .  betamethasone dipropionate (DIPROLENE) 0.05 % cream, Apply to itchy areas of itchy skin twice a day as needed., Disp: 45 g, Rfl: 3  Allergies  Allergen Reactions  . Benadryl Allergy Decongestant Other (See Comments)    other  . Prednisone Other (See Comments)    unspecified  . Sulfa (Sulfonamide Antibiotics) Other (See Comments)    Unsure of reaction  . Penicillin Rash    Immunizations by Immunization Family     Covid-19 Vaccine Unspecified 09/02/2019 (82 y.o.) 09/21/2019 (82 y.o.) 05/08/2020 (82 y.o.)    Moderna Covid-19, mRNA,LNP-S,PF 12+ Yrs 05/22/2022 (81 y.o.)      Pfizer SARS-CoV-2 Bivalent 12+ yrs 05/22/2021 (82 y.o.)      Pfizer SARS-CoV-2 Primary Series 12+ yrs 12/09/2020 (82 y.o.)      TD (Adult), 5 Lf Tetanus Toxoid, Preservative Free 08/15/2003 (82 y.o.)          Past Medical History:  Diagnosis Date  . Asthma   . Gallstones 03/19/2017  . Tinea corporis 06/15/2016    Past Surgical History:  Procedure Laterality Date  . APPENDECTOMY     Procedure: APPENDECTOMY  . CHOLECYSTECTOMY  2019   Procedure: CHOLECYSTECTOMY  . TONSILLECTOMY      Procedure: TONSILLECTOMY    Objective:  BP 124/74   Pulse 73   Temp 98.2 F (36.8 C)   Resp 16   Ht 1.549 m (5' 1)   Wt 65.6 kg (144 lb 9.6 oz)   SpO2 98%   BMI 27.32 kg/m    Wt Readings from Last 8 Encounters:  11/15/22 65.6 kg (144 lb 9.6 oz)  10/17/22 61.2 kg (135 lb)  09/14/21 65.4 kg (144 lb 3.2 oz)  07/20/20 63.5 kg (140 lb)  02/13/20 61.2 kg (135 lb)    Physical exam:  GENERAL: Patient appears well.  She is well-dressed and groomed. She is in no apparent discomfort.  She has gained some weight.  She is with her husband who also has a CPE today. DERM:  There is no rash.  There are multiple seborrheic keratoses.  There is a hard keratotic lesion on the right forehead without malignant features.  There are some benign moles and cherry hemangiomas.  There are soft subcutaneous masses at bilateral lateral ankles and left forearm consistent with lipomas.  There are no malignant appearing lesions.   HEENT:  There is no facial weakness.  External eyes and ears are normal.  PERRL. EOM's are intact. TM's and ear canals are normal.  Mouth and throat are normal. NECK: There is no mass or adenopathy.  The thyroid  is not enlarged. CV: The heart has a regular rhythm and normal rate without murmurs.  The feet have good pulses without edema. RESP:  The lungs are clear to auscultation. GI: The abdomen is soft and non-tender to palpation. MSK:  There are no joint deformities.  There slight tenderness of the pectoralis muscles.  There are no lumps or lesions on the chest or outer breasts. NEURO: Patient is alert and oriented.  Speech is clear. Cognitive function is normal. There are no focal deficits. PSYCH:  Affect is normal. She relates well.   Assessment:   1. Abnormal TSH         Plan:   There are no Patient Instructions on file for this visit.  No follow-ups on file.

## 2022-11-19 NOTE — Progress Notes (Addendum)
 Subjective  In addition to the Medicare Wellness Visit, Robin Fisher is here for her annual CPE.  In general, she feels well. She has found a way to prevent yeast infections which has to do with type of clothing. She has some soreness of the right chest. She has had full immunization against COVID-19 but has declined other vaccines.   Current Outpatient Medications:  .  albuterol HFA (PROVENTIL HFA;VENTOLIN HFA;PROAIR HFA) 90 mcg/actuation inhaler, INHALE 1-2 PUFFS EVERY 4 HOURS AS NEEDED FOR WHEEZING, Disp: 8.5 each, Rfl: 1 .  ascorbic acid (VITAMIN C) 500 mg tablet, Take 500 mg by mouth., Disp: , Rfl:  .  betamethasone dipropionate (DIPROLENE) 0.05 % cream, Apply to itchy areas of itchy skin twice a day as needed., Disp: 45 g, Rfl: 3 .  fluconazole (DIFLUCAN) 150 mg tablet, TAKE 1 TABLET BY MOUTH WEEKLY AS NEEDED FOR YEAST INFECTION, Disp: 4 tablet, Rfl: 1 .  levothyroxine sodium 50 mcg cap, Take 1 capsule (50 mcg total) by mouth daily., Disp: 90 capsule, Rfl: 0  Allergies  Allergen Reactions  . Benadryl Allergy Decongestant Other (See Comments)    other  . Prednisone Other (See Comments)    unspecified  . Sulfa (Sulfonamide Antibiotics) Other (See Comments)    Unsure of reaction  . Penicillin Rash    Immunizations by Immunization Family     Covid-19 Vaccine Unspecified 09/02/2019 (82 y.o.) 09/21/2019 (82 y.o.) 05/08/2020 (82 y.o.)    Moderna Covid-19, mRNA,LNP-S,PF 12+ Yrs 05/22/2022 (82 y.o.)      Pfizer SARS-CoV-2 Bivalent 12+ yrs 05/22/2021 (82 y.o.)      Pfizer SARS-CoV-2 Primary Series 12+ yrs 12/09/2020 (82 y.o.)      TD (Adult), 5 Lf Tetanus Toxoid, Preservative Free 08/15/2003 (82 y.o.)          Past Medical History:  Diagnosis Date  . Asthma   . Gallstones 03/19/2017  . Tinea corporis 06/15/2016    Past Surgical History:  Procedure Laterality Date  . APPENDECTOMY  1960   Procedure: APPENDECTOMY  . CHOLECYSTECTOMY  2019   Procedure: CHOLECYSTECTOMY  .  TONSILLECTOMY  1946   Procedure: TONSILLECTOMY    Objective:  BP 124/74   Pulse 73   Temp 98.2 F (36.8 C)   Resp 16   Ht 1.549 m (5' 1)   Wt 65.6 kg (144 lb 9.6 oz)   SpO2 98%   BMI 27.32 kg/m    Wt Readings from Last 8 Encounters:  11/15/22 65.6 kg (144 lb 9.6 oz)  10/17/22 61.2 kg (135 lb)  09/14/21 65.4 kg (144 lb 3.2 oz)  07/20/20 63.5 kg (140 lb)  02/13/20 61.2 kg (135 lb)   Physical exam:  GENERAL: Patient appears well.  She is well-dressed and groomed. She is in no apparent discomfort.   DERM:  There is no rash.  There are multiple seborrheic keratoses.  There is a hard keratotic lesion on the right forehead without malignant features.  There are some benign moles and cherry hemangiomas.  There are no malignant  appearing lesions.     HEENT:  There is no facial weakness.  External eyes and ears are normal.  PERRL. EOM's are intact. TM's and ear canals are normal.  Mouth and throat are normal. NECK: There is no mass or adenopathy.  The thyroid is not enlarged. CV: The heart has a regular rhythm and normal rate without murmurs.  The feet have good pulses without edema. RESP:  The lungs are clear to auscultation.  GI: The abdomen is soft and non-tender to palpation. MSK:  There are no joint deformities.  There slight tenderness of the pectoralis muscles.  There are no lumps or lesions on the chest or outer breasts. NEURO: Patient is alert and oriented.  Speech is clear. Cognitive function is normal.   Assessment:   1. Medicare annual wellness visit, subsequent      2. Health maintenance examination  Comprehensive Metabolic Panel   CBC with Differential    3. Abnormal TSH  TSH   T4, Free    4. Stage 3a chronic kidney disease (HCC)       Plan:   Patient Instructions    HEALTHCARE MAINTENANCE: Continue healthy and active lifestyle. Continue mild soap and moisturizers as needed. Continue regimen to prevent vaginal yeast infections. Okay to use Diflucan if  yeast infections do occur.   We will contact you with results of labs drawn today.    Return in about 1 year (around 11/15/2023) for not necessary.  Good luck in your new location.SABRA

## 2023-05-23 NOTE — Progress Notes (Signed)
 Subjective:  Robin Fisher is here for management of hypothyroidism.   She has taken thyroid supplement every day for 3 months. She cannot tell that she feels any different off vs. on the thyroid supplement. She has had full immunization against COVID-19 but has declined other vaccines.  In general, she does not want medical interventions.   She had Covid for two weeks and she finally got over it about a week ago. There is a still a frequent cough or throat clearing.  It is a dry cough that occurs at night. There is sometimes aspiration which causes heartburn.  Sometimes the stomach roils an hour or two after she eats.  She would like a refill of diflucan to treat vaginal yeast infection that occurs several time a year, There is trouble initiating sleep every night.  Sometimes she wakes up and cannot go back to sleep.  She wants to be checked for lower respiratory infection.    Current Outpatient Medications:  .  Denta 5000 Plus 1.1 % crea, See Admin Instructions., Disp: , Rfl:  .  fluconazole (DIFLUCAN) 150 mg tablet, Take 1 tablet weekly if needed for yeast infection., Disp: 4 tablet, Rfl: 6 .  levothyroxine sodium 50 mcg cap, Take 1 tablet daily to supplement thyroid function., Disp: 90 capsule, Rfl: 3 .  nitrofurantoin, macrocrystal-monohydrate, (Macrobid) 100 mg capsule, Take 1 capsule (100 mg total) by mouth 2 (two) times a day for 7 days., Disp: 14 capsule, Rfl: 0  Allergies  Allergen Reactions  . Benadryl Allergy Decongestant Other (See Comments)    other  . Ciprofloxacin Other (See Comments)    Foot and lower leg pain  . Prednisone Other (See Comments)    unspecified  . Sulfa (Sulfonamide Antibiotics) Other (See Comments)    Unsure of reaction  . Penicillin Rash    Immunizations by Immunization Family     Covid-19 Vaccine Unspecified 09/02/2019 (82 y.o.) 09/21/2019 (82 y.o.) 05/08/2020 (82 y.o.)    Moderna Covid-19, mRNA,LNP-S,PF 12+ Yrs 05/22/2022 (81 y.o.)  05/23/2023 (82 y.o.)     Pfizer SARS-CoV-2 Bivalent 12+ yrs 05/22/2021 (82 y.o.)      Pfizer SARS-CoV-2 Primary Series 12+ yrs 12/09/2020 (82 y.o.)      TD (Adult), 5 Lf Tetanus Toxoid, Preservative Free 08/15/2003 (82 y.o.)          Past Medical History:  Diagnosis Date  . Asthma   . Eczema   . Gallstones 03/19/2017  . Hypothyroidism   . Tinea corporis 06/15/2016    Past Surgical History:  Procedure Laterality Date  . APPENDECTOMY  1960   Procedure: APPENDECTOMY  . CHOLECYSTECTOMY  2019   Procedure: CHOLECYSTECTOMY  . TONSILLECTOMY  1946   Procedure: TONSILLECTOMY    Objective:  BP 124/72   Pulse 73   Temp 98.1 F (36.7 C)   Resp 16   Ht 1.549 m (5' 1)   Wt 66.3 kg (146 lb 3.2 oz)   SpO2 98%   BMI 27.62 kg/m    Wt Readings from Last 8 Encounters:  06/02/23 63.5 kg (140 lb)  05/29/23 63.5 kg (140 lb)  05/23/23 66.3 kg (146 lb 3.2 oz)  11/15/22 65.6 kg (144 lb 9.6 oz)  10/17/22 61.2 kg (135 lb)  09/14/21 65.4 kg (144 lb 3.2 oz)  07/20/20 63.5 kg (140 lb)  02/13/20 61.2 kg (135 lb)   Physical exam:  GENERAL: Patient appears well.  She is well-dressed and groomed. She is in no apparent discomfort.  She has  gained some weight.  She is with her husband who has an appointment today. DERM:  There is no rash.  There are multiple seborrheic keratoses.  There is a hard keratotic lesion on the right forehead without malignant features.  There are some benign moles and cherry hemangiomas.  There are soft subcutaneous masses at bilateral lateral ankles and left forearm consistent with lipomas.  There are no malignant appearing lesions.   HEENT:  There is no facial weakness.  External eyes and ears are normal.  PERRL. EOM's are intact. TM's and ear canals are normal.  Mouth and throat are normal. NECK: There is no mass or adenopathy.  The thyroid is not enlarged. CV: The heart has a regular rhythm and normal rate without murmurs.  The feet have good pulses without edema. RESP:   The lungs are clear to auscultation. GI: The abdomen is soft and non-tender to palpation. MSK:  There are no joint deformities.  There slight tenderness of the pectoralis muscles.  There are no lumps or lesions on the chest or outer breasts. NEURO: Patient is alert and oriented.  Speech is clear. Cognitive function is normal. There are no focal deficits. PSYCH:  Affect is normal. She relates well.   Assessment:   1. Subclinical hypothyroidism  TSH   T4, Free    2. Chronic insomnia      3. Encounter for vaccination  Moderna Covid-19 12+ yrs      Plan:   Patient Instructions   SUBCLINICAL HYPOTHYROIDISM: Thyroid function has been checked and is normal on current supplement. Continue taking levothyroxine 50 mcg daily. Refills have been sent for a year.  INSOMNIA: Okay to try taking melatonin at or before bedtime. You may start with 3 mg tablets. This may be doubled or tripled if needed.  CONCERN FOR LOWER RESPIRATORY INFECTION: On physical examination, there is no evidence for pneumonia or bronchitis.  HEALTH MAINTENANCE: Continue healthy lifestyle. Flu shot and COVID shot are given today. Continue getting flu shots annually. See whether COVID shots are considered annually.  OTHER: At patient request, prescription for Diflucan is sent in case of recurrent vaginitis.   Return in 6 months (on 11/20/2023) for medicine check and labs, and a Medicare Wellness Visit with Bernardino Level. NP..

## 2023-05-29 NOTE — Progress Notes (Signed)
 Robin Fisher is a 82 y.o. with  Active Ambulatory Problems    Diagnosis Date Noted  . Lipoma of left lower extremity 06/24/2016  . History of laparoscopic cholecystectomy 03/19/2017  . Medicare annual wellness visit, subsequent 07/26/2020  . Lipoma of right lower extremity 07/31/2020  . Stage 3a chronic kidney disease (HCC) 09/14/2021  . Abnormal TSH 09/14/2021  . Chronic insomnia 09/14/2021   Resolved Ambulatory Problems    Diagnosis Date Noted  . No Resolved Ambulatory Problems   Past Medical History:  Diagnosis Date  . Asthma   . Eczema   . Gallstones 03/19/2017  . Hypothyroidism   . Tinea corporis 06/15/2016   who presents today at Urgent Care for  Chief Complaint  Patient presents with  . Female Dysuria    Reports urinary frequency, burning with urination, flank pain ongoing for a few days. Has been drinking cranberry juice, cranberry pills, and staying hydrated. Unsure about fevers.      82 year old female seen in clinic for urinary tract symptoms.  She states that for the last few days she has had some frequency and urgency.  She states that she has been drinking cranberry juice and lots of water.  She states that last night when she was urinating she felt a cold chill go through her and noticed that urination was painful.  She endorses mild low back pain.  She denies fevers though she has not checked her temperature at home.  She denies other symptoms.      reports that she has never smoked. She has never used smokeless tobacco.  Review of Systems  Genitourinary:  Positive for difficulty urinating, dysuria, flank pain, frequency and urgency.  All other systems reviewed and are negative.  Physical Exam  BP 142/63 (BP Location: Right arm, Patient Position: Sitting)   Pulse 90   Temp 99 F (37.2 C) (Oral)   Resp 18   Ht 1.575 m (5' 2)   Wt 63.5 kg (140 lb)   SpO2 98%   BMI 25.61 kg/m   Constitutional:      General: Patient is not in acute distress.     Appearance: Normal appearance.  Neurological:     General: No focal deficit present.     Mental Status: alert and oriented to person, place, and time. Cardiovascular:     Heart sounds: Normal heart sounds. No murmur heard.    No Lower extremity edema noted Pulmonary:     Effort: Pulmonary effort is normal. No respiratory distress.     Breath sounds: No wheezing, rhonchi or rales.  Abdominal:     General: Bowel sounds are normal. There is no distension.     Tenderness: There is no abdominal tenderness.  Musculoskeletal:        General: Normal range of motion.     Neck:  No rigidity.  Skin:    General: Skin is warm and dry.  Psychiatric:        Mood and Affect: Mood normal.   No orders to display     Lab Results (last 24 hours)     Procedure Component Value Ref Range Date/Time   Urine Culture [[id number]] Collected: 05/29/23 1229   Lab Status: In process Specimen: Urine from Clean Catch Updated: 05/29/23 1229   POC Urinalysis Auto without Microscopic [[id number]]  (Abnormal) Collected: 05/29/23 1218   Lab Status: Final result Specimen: Urine from Clean Catch Updated: 05/29/23 1219    Color, Urine Yellow Yellow     Clarity,  Urine Clear Clear     Glucose, Urine Negative Negative mg/dL     Bilirubin, Urine Negative Negative     Ketones, Urine Negative Negative mg/dL     Specific Gravity, Urine 1.025 1.010, 1.015, 1.020, 1.025     Blood, Urine Moderate* Negative     pH, Urine 5.5 5.0, 5.5, 6.0, 6.5, 7.0, 7.5, 8.0     Protein, Urine Negative Negative mg/dL     Urobilinogen, Urine 0.2 <2.0 mg/dL     Nitrite, Urine Negative Negative     Leukocyte Esterase, Urine Trace* Negative     Kit/Device Lot # [id number]    Kit/Device Expiration Date 11/11/2023          DIAGNOSIS/PLAN 1. UTI symptoms - POC Urinalysis Auto without Microscopic - Urine Culture - ciprofloxacin (CIPRO) 500 mg tablet; Take 1 tablet (500 mg total) by mouth 2 (two) times a day for 10 days.  Dispense: 20 tablet;  Refill: 0  2. Abnormal urinalysis - Urine Culture - ciprofloxacin (CIPRO) 500 mg tablet; Take 1 tablet (500 mg total) by mouth 2 (two) times a day for 10 days.  Dispense: 20 tablet; Refill: 0  3. Urinary tract infection with hematuria, site unspecified - Urine Culture - ciprofloxacin (CIPRO) 500 mg tablet; Take 1 tablet (500 mg total) by mouth 2 (two) times a day for 10 days.  Dispense: 20 tablet; Refill: 0 - phenazopyridine (PYRIDIUM) 200 mg tablet; Take 1 tablet (200 mg total) by mouth in the morning and 1 tablet (200 mg total) at noon and 1 tablet (200 mg total) in the evening. Take with meals. Do all this for 10 doses.  Dispense: 10 tablet; Refill: 0     We discussed risks and side effects of medications, and also discussed red flags which would warrant immediate follow-up.      82 year old female seen in clinic for urinary tract symptoms.  She states that for the last few days she has had some frequency and urgency.  She states that she has been drinking cranberry juice and lots of water.  She states that last night when she was urinating she felt a cold chill go through her and noticed that urination was painful.  She endorses mild low back pain.  She denies fevers though she has not checked her temperature at home. She denies other symptoms. Urinalysis shows hematuria and leukocytes. Prescribed Ciprofloxacin 500 mg bid x 10 days and pyridium. Urine sent for culture to make sure correct antibiotic coverage.        *Some images could not be shown.

## 2023-06-02 NOTE — Progress Notes (Signed)
 [address] - AMBULATORY [facility]  - [facility] [place] [address] [place] [id number]  History of Present Illness   History given by patient  Patient ID: Robin Fisher is a 82 y.o. female who c/o 4 day history of reaction to cipro and pyridium given for UTI. Pt took cipro for 3 days to date. Pt discontinued both pyridium and cipro. Today, denies any UTI symptoms including dysuria, urinary urgency, frequency, suprapubic pain, or low back pain. Pt inquiring if she needs to continue meds.   Denies fever, chills, night sweats, chest pain, palpitations, irregular heartbeat, shortness of breath, or GI symptoms such as nausea, vomiting, or diarrhea.   Patient has no co-morbidities  or medications that might increase the risk for developing a severe infection   Past Medical History:  Diagnosis Date  . Asthma   . Eczema   . Gallstones 03/19/2017  . Hypothyroidism   . Tinea corporis 06/15/2016    Review of Systems   Review of Systems   Negative except per HPI noted above.   Physicial Exam   Vitals:   06/02/23 1723  BP: 138/61  Pulse: 73  Resp: 16  Temp: 98.2 F (36.8 C)  SpO2: 98%     Physical Exam Vitals and nursing note reviewed.  Constitutional:      Appearance: Normal appearance.  HENT:     Head: Normocephalic and atraumatic.  Eyes:     Extraocular Movements: Extraocular movements intact.  Cardiovascular:     Rate and Rhythm: Normal rate and regular rhythm.  Pulmonary:     Effort: Pulmonary effort is normal.     Breath sounds: Normal breath sounds.  Abdominal:     General: Bowel sounds are normal.     Palpations: Abdomen is soft.     Tenderness: There is no abdominal tenderness. There is no right CVA tenderness or left CVA tenderness.  Musculoskeletal:        General: Normal range of motion.     Cervical back: Normal range of motion and neck supple.  Skin:    General: Skin is warm and dry.  Neurological:      Mental Status: She is alert and oriented to person, place, and time.     Gait: Gait is intact.  Psychiatric:        Mood and Affect: Mood normal.        Behavior: Behavior is cooperative.       Diagnosis   [name] was seen today for medication reaction.  Diagnoses and all orders for this visit:  Adverse effect of drug, initial encounter     Medical Decision Making BRITTINIE WHERLEY is a 82 y.o. female who presents to urgent care today with complaints of  Chief Complaint  Patient presents with  . Medication Reaction    Pt states she has been having lower right leg pain after starting the cipro. Pt states she read that cipro can cause ligament pain. She wants to know if she can stop the med or change it to a different script. Pt also states she stopped taking the pyridium because she had a heart rate of 120. She states she called and they told her she could discontinue the med.   On exam, VS stable and patient is afebrile and in no acute distress. Appears well-hydrated and capillary refill <2 seconds. Pt diagnosed with UTI 4 days prior. Urine culture positive for E.Coli. Pt has been on  Cipro for 3 days, and is currently asymptomatic. No flank pain, no CVA tenderness, no pelvic pain, or no fever,chills, rigors, or malaise. Given that pt did not have complicated UTI and denies having had systemic symptoms, I advised pt to discontinue cipro, as regimen on UTD indicates 3-5 day treatment is adequate for simple cystitis. Will send rx for Macrobid to have on hand, if pt becomes symptomatic for UTI.  Pt advised to monitor for worsening symptoms and f/u with PCP as needed. Pt v/u and agreed with plan.    Discharge Information  Symptomatic management discussed.  Patient was given verbal and written instructions on symptoms that necessitate return to the UC/ED, and instructed to f/u w/ UC or PCP if not improving in expected timeframe.   2. Patient/parent has been instructed on RX/OTC medications,  dosages, side effects, and possible interactions as associated with each diagnosis in my impression and plan  above.   3. Patient education (verbal/handout) given on diagnosis, pathophysiology, treatment of diagnosis, side effects of medication use for treatment, restrictions while taking medication, supportives measures such as staying hydrated.   4. Red Flags associated with diagnosis/es were reviewed and patient instructed on action plan if red flags develop. They have been instructed that if symptoms worsen or red flags develop they should return to Urgent Care, go to the nearest ED, or activate EMS/911.     5. Patient and/or parent/guardian (if applicable) agreed with plan and voiced understanding.  No barriers to adherence perceived by myself.  Portions of this note may have been dictated using Dragon dictation software/hardware and may contain grammatical or spelling errors.   Electronically signed by Elvira Dublin, PA-C at 7:31 PM.  If a new prescription was given today, then I discussed potential side effects, drug interactions, instructions for taking the medication, and the consequences of not taking it.    F/u: Follow up closely with primary care provider (PCP) and other specialists for further care and routine care, but seek medical attention sooner if worsening/concerning signs or symptoms.  Follow up with PCP   Electronically signed by: Elvira Dublin, PA-C 06/02/2023 7:31 PM

## 2023-10-01 NOTE — Progress Notes (Signed)
 Robin Fisher is a 83 y.o. with  Active Ambulatory Problems    Diagnosis Date Noted  . Lipoma of left lower extremity 06/24/2016  . History of laparoscopic cholecystectomy 03/19/2017  . Medicare annual wellness visit, subsequent 07/26/2020  . Lipoma of right lower extremity 07/31/2020  . Stage 3a chronic kidney disease (HCC) 09/14/2021  . Subclinical hypothyroidism 09/14/2021  . Chronic insomnia 09/14/2021  . Subclinical hypothyroidism 06/10/2023   Resolved Ambulatory Problems    Diagnosis Date Noted  . No Resolved Ambulatory Problems   Past Medical History:  Diagnosis Date  . Asthma   . Eczema   . Gallstones 03/19/2017  . Hypothyroidism   . Tinea corporis 06/15/2016   who presents today at Urgent Care for  Chief Complaint  Patient presents with  . chest discomfort     Pt c/o chest pain and congestion. Upper back also hurting. No other symptoms     83 year old female seen in clinic for concerns of pneumonia.  She states that she does not really think she is ill but anytime she feels anything weird in her chest she wants to be listened to because she had pneumonia and it was very difficult recovery for her.  She states that her chest pain was actually pain in her breast and her mid back at the bra line.  She states this morning likely from her lifting something heavy but just wants to be reassured that her lungs are normal.  She denies cough, runny nose, sneezing, sore throat, chest pain, shortness of breath, fever, nausea, vomiting, diarrhea.      reports that she has never smoked. She has never used smokeless tobacco.  Review of Systems  Respiratory:  Positive for chest tightness.   Musculoskeletal:  Positive for back pain.  All other systems reviewed and are negative.   Physical Exam  BP (!) 136/58   Pulse 81   Temp 98.1 F (36.7 C) (Tympanic)   Resp 20   Ht 1.588 m (5' 2.5)   Wt 63.5 kg (140 lb)   SpO2 98%   BMI 25.20 kg/m   Constitutional:      General:  Patient is not in acute distress.    Appearance: Normal appearance.  Neurological:     General: No focal deficit present.     Mental Status: alert and oriented to person, place, and time.  HENT:     Eyes:     Conjunctiva/sclera: Conjunctivae normal. Pharynx normal    There is no associated anterior cervical lymphadenopathy Cardiovascular:     Heart sounds: Normal heart sounds. No murmur heard.    No Lower extremity edema noted Pulmonary:     Effort: Pulmonary effort is normal. No respiratory distress.     Breath sounds: No wheezing, rhonchi or rales.  Musculoskeletal:        General: Normal range of motion.     Neck:  No rigidity.  Skin:    General: Skin is warm and dry.  Psychiatric:        Mood and Affect: mild anxiety prior to assessment  No orders to display     Lab Results (last 24 hours)     ** No results found for the last 24 hours. **          DIAGNOSIS/PLAN 1. Anxiety about health (Primary)  2. Acute bilateral thoracic back pain        We discussed risks and side effects of medications, and also discussed red flags which would  warrant immediate follow-up.      83 year old female seen in clinic for concerns of pneumonia.  She states that she does not really think she is ill but anytime she feels anything weird in her chest she wants to be listened to because she had pneumonia and it was very difficult recovery for her.  She states that her chest pain was actually pain in her breast and her mid back at the bra line.  She states this morning likely from her lifting something heavy but just wants to be reassured that her lungs are normal.  Assessment is completely normal.  Lungs are clear bilaterally throughout.  She feels very reassured by this.  Offered chest x-ray to which she declined.  She states she has not had any respiratory symptoms.

## 2023-10-17 NOTE — Progress Notes (Signed)
 Subjective: Patient ID:  Robin Fisher is a 83 y.o. female  Chief Complaint  Patient presents with  . Pain    Pain under left breast x1 wk. Thinks she may have a kidney infection. Had vaginal itching a weekago and took diflucan and felt better.     The following information was reviewed by members of the visit team:  Tobacco  Allergies  Meds  Problems  Med Hx  Surg Hx  OB Status   Fam Hx    83 year old female presents for evaluation of multiple complaints. She had pneumonia recently and feels there is a band around her lower chest that does go around her back sometimes. She also has some right nipple pain, no drainage, palpable mass or skin change.  She reports her last mammogram was 1992. She now has developed LUQ pain, she denies any nausea, vomiting or change in bowel/bladder habits.  She does feel bloated after eating but states this has been ongoing and is not new.  She also reports her provider, now retired, thought she may have IBS, she denies any hx of pancreatitis or ulcer disease.      Review of Systems  Constitutional:  Negative for chills and fever.  Respiratory:  Negative for shortness of breath.   Cardiovascular:  Negative for chest pain.  Gastrointestinal:  Positive for abdominal pain. Negative for diarrhea, nausea and vomiting.  Genitourinary:  Negative for dysuria.  Skin:  Negative for rash and wound.  Neurological:  Negative for dizziness and headaches.      Objective  Vitals:   10/17/23 1602  BP: 147/74  BP Location: Left arm  Patient Position: Sitting  Pulse: 80  Resp: 18  Temp: 98.6 F (37 C)  TempSrc: Tympanic  SpO2: 98%  Weight: 63.5 kg (140 lb)    No LMP recorded. Patient is postmenopausal.  Physical Exam Vitals and nursing note reviewed.  Constitutional:      Appearance: Normal appearance. She is normal weight.  Cardiovascular:     Rate and Rhythm: Normal rate and regular rhythm.     Heart sounds: Normal heart sounds.   Pulmonary:     Effort: Pulmonary effort is normal.     Breath sounds: Normal breath sounds.  Abdominal:     General: Bowel sounds are normal.     Palpations: Abdomen is soft.  Skin:    General: Skin is warm and dry.     Capillary Refill: Capillary refill takes 2 to 3 seconds.  Neurological:     General: No focal deficit present.     Mental Status: She is alert and oriented to person, place, and time.  Psychiatric:        Mood and Affect: Mood normal.        Behavior: Behavior normal.      Recent Results (from the past 24 hours)  POC Urinalysis Auto without Microscopic   Collection Time: 10/17/23  3:53 PM  Result Value Ref Range   Color, Urine Dark Yellow (A) Yellow   Clarity, Urine Cloudy (A) Clear   Glucose, Urine Negative Negative mg/dL   Bilirubin, Urine Negative Negative   Ketones, Urine Negative Negative mg/dL   Specific Gravity, Urine 1.020 1.010, 1.015, 1.020, 1.025   Blood, Urine Large (A) Negative   pH, Urine 5.0 5.0, 5.5, 6.0, 6.5, 7.0, 7.5, 8.0   Protein, Urine Negative Negative mg/dL   Urobilinogen, Urine 0.2 <2.0 mg/dL   Nitrite, Urine Negative Negative   Leukocyte Esterase, Urine  Negative Negative   Kit/Device Lot # [id number]    Kit/Device Expiration Date 11/11/2023   POCT Metabolic Panel, Comprehensive   Collection Time: 10/17/23  5:04 PM  Result Value Ref Range   Sodium, POC 140 128 - 145 mmol/L   Potassium, POC 4.0 3.6 - 5.1 mmol/L   Total CO2, POC 25 18 - 33 mmol/L   Chloride, POC 110 (A) 98 - 108 mmol/L   Glucose, POC 100 73 - 118 mg/dL   Calcium, POC 9.8 8 - 10.3 mg/dL   BUN, POC 16 7 - 22 mg/dL   Creatinine, POC 1.1 0.6 - 1.2 mg/dL   Alkaline Phosphatase, POC 83 42 - 141 U/L   Alanine Aminotransferase, POC 16 10 - 47 U/L   Aspartate Aminotransferase, POC 22 11 - 38 U/L   Total Bilirubin, POC 0.6 0.2 - 1.6 mg/dL   Albumin, POC 4.1 3.3 - 5.5 g/dL   Total Protein, POC 7.6 6.4 - 8.1 g/dL   eGFR, POC 50 (A) >=40 mL/min/1.82m2   Kit/Device Lot #  [id number]    Kit/Device Expiration Date 06/09/2024      Radiologist interpretation:   XR Abdomen 2 Views With Chest 1 View  Final Result by Debby Catarina Dibble, MD (03/06 1630)  X-RAY ACUTE ABDOMINAL SERIES WITH CHEST, 10/17/2023 4:14 PM    INDICATION: feels band around chest, right breast pain, luq pain and   hematuria, Left upper quadrant pain \ R10.12 Left upper quadrant pain \   R31.9 Hematuria, unspecified   feels band around chest, right breast pain, luq pain and hematuria  ADDITIONAL HISTORY: None.  COMPARISON: Chest radiograph Jan 02, 2017    TECHNIQUE: Supine and erect views of the abdomen with a single erect view   of the chest are presented.    FINDINGS:    CHEST:  .  Lungs: No consolidation, pulmonary edema, or mass. Minor atelectasis in   the lung bases.  .  Pleura: No effusion. No pneumothorax.  .  Cardiac/mediastinum: Normal size and contour. No adenopathy.    ABDOMEN:  .  Stomach/intestines: Nonobstructive bowel gas pattern.  .  Peritoneum: No pneumoperitoneum.  .  Calcifications/stones: No pathologic calcification. A couple of   phleboliths project over the lower pelvis.  .  Bones/soft tissues: Osteopenia. Polyarticular degenerative changes. No   acute bony abnormality.  .  Lines/tubes/surgical: Cholecystectomy clips.    .  Additional comments: None.    IMPRESSION:  1.  Nonobstructive bowel gas pattern.   2.  No pneumoperitoneum.   3.  No acute cardiopulmonary process.       EKG: normal sinus rhythm 74 BPM, LAD, LBBB, no acute changes.  Similar to past   Assessment/Plan   Robin Fisher was seen today for pain.  Diagnoses and all orders for this visit:  Flank pain -     POC Urinalysis Auto without Microscopic  LUQ pain -     POCT Metabolic Panel, Comprehensive -     CBC with Differential -     Lipase -     Magnesium -     ECG 12 lead -     XR Abdomen 2 Views With Chest 1 View  Breast pain, right -     ECG 12 lead -     MG Breast Diag W Cad  Bilat; Future  Hematuria, unspecified type -     Urine Culture -     XR Abdomen 2 Views With Chest 1 View    Patient has  been instructed on RX/ OTC medications, dosages, side effects, and possible interactions as associated with each diagnosis in my impression and plan above.  2.   Patient education (verbal/handout) given on diagnosis, pathophysiology, treatment of diagnosis, side effects of medication use for treatment, restrictions while taking medication.  Supportive       Care measures as directed on AVS.  Red Flags associated with diagnosis/es were reviewed and patient instructed on action plan if red flags develop.  3.   Urgent Care Disposition:  Follow up with PCP       They have been instructed that if symptoms worse that should go to Urgent Care, go to the nearest ED, or activate EMS.  4.   Patient agreed with plan and voiced understanding.  NO barriers to adherence perceived by myself.  Electronically signed: Rosaline Gerome Skye FNP  Thu 10/17/2023 5:29 PM

## 2024-03-02 ENCOUNTER — Ambulatory Visit (HOSPITAL_COMMUNITY)
Admission: RE | Admit: 2024-03-02 | Discharge: 2024-03-02 | Disposition: A | Discharge status: Home / Self Care | Source: Ambulatory Visit | Attending: Surgery | Admitting: Surgery

## 2024-03-02 ENCOUNTER — Encounter (HOSPITAL_COMMUNITY): Payer: Self-pay | Admitting: Family Medicine

## 2024-03-02 ENCOUNTER — Other Ambulatory Visit (HOSPITAL_COMMUNITY): Payer: Self-pay | Admitting: Family Medicine

## 2024-03-02 DIAGNOSIS — N183 Chronic kidney disease, stage 3 unspecified: Secondary | ICD-10-CM

## 2024-03-02 DIAGNOSIS — N189 Chronic kidney disease, unspecified: Secondary | ICD-10-CM

## 2024-05-04 ENCOUNTER — Other Ambulatory Visit (HOSPITAL_BASED_OUTPATIENT_CLINIC_OR_DEPARTMENT_OTHER): Payer: Self-pay

## 2024-05-04 MED ORDER — COMIRNATY 30 MCG/0.3ML IM SUSY
0.3000 mL | PREFILLED_SYRINGE | Freq: Once | INTRAMUSCULAR | 0 refills | Status: AC
  Filled 2024-05-04: qty 0.3, 1d supply, fill #0
